# Patient Record
Sex: Female | Born: 1975 | Race: White | Hispanic: No | Marital: Single | Smoking: Former smoker
Health system: Southern US, Community
[De-identification: ages and names within clinical notes are randomized; demographics above are authoritative.]

## PROBLEM LIST (undated history)

## (undated) DIAGNOSIS — Z87898 Personal history of other specified conditions: Secondary | ICD-10-CM

## (undated) DIAGNOSIS — I10 Essential (primary) hypertension: Secondary | ICD-10-CM

## (undated) DIAGNOSIS — E669 Obesity, unspecified: Secondary | ICD-10-CM

## (undated) DIAGNOSIS — F419 Anxiety disorder, unspecified: Secondary | ICD-10-CM

## (undated) DIAGNOSIS — K219 Gastro-esophageal reflux disease without esophagitis: Secondary | ICD-10-CM

## (undated) HISTORY — DX: Gastro-esophageal reflux disease without esophagitis: K21.9

## (undated) HISTORY — DX: Obesity, unspecified: E66.9

## (undated) HISTORY — DX: Personal history of other specified conditions: Z87.898

## (undated) HISTORY — DX: Essential (primary) hypertension: I10

## (undated) HISTORY — DX: Anxiety disorder, unspecified: F41.9

---

## 2008-04-14 ENCOUNTER — Emergency Department: Payer: Self-pay | Admitting: Emergency Medicine

## 2010-06-20 ENCOUNTER — Inpatient Hospital Stay (INDEPENDENT_AMBULATORY_CARE_PROVIDER_SITE_OTHER)
Admission: RE | Admit: 2010-06-20 | Discharge: 2010-06-20 | Disposition: A | Discharge status: Home / Self Care | Payer: BC Managed Care – PPO | Source: Ambulatory Visit | Attending: Emergency Medicine | Admitting: Emergency Medicine

## 2010-06-20 DIAGNOSIS — J45909 Unspecified asthma, uncomplicated: Secondary | ICD-10-CM

## 2010-06-20 DIAGNOSIS — R05 Cough: Secondary | ICD-10-CM

## 2010-10-07 ENCOUNTER — Encounter: Payer: Self-pay | Admitting: Internal Medicine

## 2010-10-07 ENCOUNTER — Ambulatory Visit (INDEPENDENT_AMBULATORY_CARE_PROVIDER_SITE_OTHER): Payer: BC Managed Care – PPO | Admitting: Internal Medicine

## 2010-10-07 ENCOUNTER — Ambulatory Visit (INDEPENDENT_AMBULATORY_CARE_PROVIDER_SITE_OTHER)
Admission: RE | Admit: 2010-10-07 | Discharge: 2010-10-07 | Disposition: A | Discharge status: Home / Self Care | Payer: BC Managed Care – PPO | Source: Ambulatory Visit | Attending: Internal Medicine | Admitting: Internal Medicine

## 2010-10-07 VITALS — BP 128/80 | HR 71 | Temp 98.8°F | Ht 69.0 in | Wt 219.0 lb

## 2010-10-07 DIAGNOSIS — R05 Cough: Secondary | ICD-10-CM

## 2010-10-07 DIAGNOSIS — R059 Cough, unspecified: Secondary | ICD-10-CM

## 2010-10-07 DIAGNOSIS — K219 Gastro-esophageal reflux disease without esophagitis: Secondary | ICD-10-CM

## 2010-10-07 DIAGNOSIS — R051 Acute cough: Secondary | ICD-10-CM | POA: Insufficient documentation

## 2010-10-07 NOTE — Assessment & Plan Note (Signed)
Seems to have been infectious and may have improved with recent antibiotic course Mild symptoms now CXR looks fine (?slight increased bronchial markings) Spirometry normal  Discussed stopping all cigarettes (rare now) Recommended no Rx  Try to increase activity Consider GERD Rx if cough persists

## 2010-10-07 NOTE — Assessment & Plan Note (Addendum)
Mild throat symptoms chronically but not enough to warrant Rx unless the cough is persistent--then it would make sense to try acid blocking rx Discussed limiting her caffeinated tea

## 2010-10-07 NOTE — Progress Notes (Signed)
  Subjective:    Patient ID: Linda Love, female    DOB: 12-18-1975, 35 y.o.   MRN: [id number]  HPI Wanted to establish as patient  Concerned about respiratory infection she caught in March Had to go to urgent care at [facility] for persistent cough Given inhaler and nasal spray Felt like she never cleared out the congestion in chest occ wheezing Never had previous respiratory problems Finally clearing up some but not back to normal  Used a lot of mucinex Didn't get antibiotic but used septra DS that was father's---felt it helped some. Finished it about 2 weeks ago after 10 days Rx Got rash around her waist after being out in the sun --discussed photosensitivity  No overt heartburn Does get some throat clearing after eating and occ voice changes No globus feeling or dysphagia  No current outpatient prescriptions on file prior to visit.    No Known Allergies  No past medical history on file.  No past surgical history on file.  Family History  Problem Relation Age of Onset  . Hypertension Mother   . Cancer Mother     skin cancer  . Alcohol abuse Father   . Heart disease Father   . Atrial fibrillation Father   . Cancer Maternal Aunt     lymphoma  . Atrial fibrillation Paternal Aunt   . Diabetes Paternal Grandmother   . Cancer Cousin     History   Social History  . Marital Status: Single    Spouse Name: N/A    Number of Children: N/A  . Years of Education: N/A   Occupational History  . Production assistant, radio   Social History Main Topics  . Smoking status: Current Some Day Smoker  . Smokeless tobacco: Never Used   Comment: has cut down since the cough. Rarely now  . Alcohol Use: Yes  . Drug Use: No  . Sexually Active: Not on file   Other Topics Concern  . Not on file   Social History Narrative  . No narrative on file   Review of Systems Noticed cracking at right angle of lip since all the cough Had shingles across abdomen some years  ago--worried about recurrence when she got the rash No chest pain No palpitations No edema Did notice "gurgle" under sternum while on the antibiotic Did have some heartburn with stress last year---not an ongoing problem No enviromnental allergies Has almost stopped smoking completely Tends to turn to food with stress----occ bingeing but this is better Has lost about 10# in past 6 months     Objective:   Physical Exam        Assessment & Plan:

## 2010-12-03 ENCOUNTER — Encounter: Payer: Self-pay | Admitting: Internal Medicine

## 2010-12-04 ENCOUNTER — Ambulatory Visit (INDEPENDENT_AMBULATORY_CARE_PROVIDER_SITE_OTHER): Payer: BC Managed Care – PPO | Admitting: Internal Medicine

## 2010-12-04 ENCOUNTER — Encounter: Payer: Self-pay | Admitting: Internal Medicine

## 2010-12-04 VITALS — BP 122/76 | HR 76 | Temp 98.6°F | Ht 69.0 in | Wt 221.8 lb

## 2010-12-04 DIAGNOSIS — J020 Streptococcal pharyngitis: Secondary | ICD-10-CM | POA: Insufficient documentation

## 2010-12-04 DIAGNOSIS — J029 Acute pharyngitis, unspecified: Secondary | ICD-10-CM

## 2010-12-04 LAB — POCT RAPID STREP A (OFFICE): Rapid Strep A Screen: POSITIVE — AB

## 2010-12-04 MED ORDER — PENICILLIN V POTASSIUM 500 MG PO TABS: 1000.0000 mg | ORAL_TABLET | Freq: Two times a day (BID) | ORAL | Status: AC

## 2010-12-04 NOTE — Progress Notes (Signed)
  Subjective:    Patient ID: Linda Love, female    DOB: 12-Apr-1975, 35 y.o.   MRN: [id number]  HPI 4 days ago started not feeling well Awoke 3 days ago with mouthful of yellow phlegm with blood in mouth Had fever and aching the next day Some shakes at night Skipped work 2 days ago  Progressively worsening sore throat Did some better with aching yesterday Has seen white patches in throat Some neck tenderness  Ears popping  Only brief nasal congestion--and still with AM drainage No cough  Tried OTC cold meds and advil---may have helped some  Current Outpatient Prescriptions on File Prior to Visit  Medication Sig Dispense Refill  . Chaste Tree (VITEX EXTRACT PO) Take by mouth daily as needed.        . Echinacea 500 MG CAPS Take by mouth daily.        Marland Kitchen RASPBERRY PO Take by mouth daily as needed.          No Known Allergies  Past Medical History  Diagnosis Date  . GERD (gastroesophageal reflux disease)     ??mild LPR symptoms    No past surgical history on file.  Family History  Problem Relation Age of Onset  . Hypertension Mother   . Cancer Mother     skin cancer  . Alcohol abuse Father   . Heart disease Father   . Atrial fibrillation Father   . Cancer Maternal Aunt     lymphoma  . Atrial fibrillation Paternal Aunt   . Diabetes Paternal Grandmother   . Cancer Cousin     History   Social History  . Marital Status: Single    Spouse Name: N/A    Number of Children: N/A  . Years of Education: N/A   Occupational History  . Production assistant, radio   Social History Main Topics  . Smoking status: Former Smoker    Quit date: 10/28/2010  . Smokeless tobacco: Never Used   Comment: has cut down since the cough. Rarely now  . Alcohol Use: Yes  . Drug Use: No  . Sexually Active: Not on file   Other Topics Concern  . Not on file   Social History Narrative  . No narrative on file      Review of Systems No rash Slight nausea but no major  nausea Able to eat    Objective:   Physical Exam  Constitutional: She appears well-developed and well-nourished. No distress.  HENT:  Right Ear: External ear normal.  Left Ear: External ear normal.       No sinus tenderness Moderate pharyngeal injection with slight exudates + palatal petechiae  Neck: Normal range of motion. Neck supple.       Small slightly tender ant cervical nodes  Pulmonary/Chest: Effort normal and breath sounds normal. No respiratory distress. She has no wheezes. She has no rales.  Lymphadenopathy:    She has cervical adenopathy.  Skin: No rash noted.          Assessment & Plan:

## 2010-12-04 NOTE — Assessment & Plan Note (Signed)
Classic presentation and rapid test positive Around a lot of family (youngsters) last weekend Will treat with PCN Supportive Rx

## 2011-02-15 ENCOUNTER — Encounter: Payer: Self-pay | Admitting: Family Medicine

## 2011-02-15 ENCOUNTER — Ambulatory Visit (INDEPENDENT_AMBULATORY_CARE_PROVIDER_SITE_OTHER): Payer: BC Managed Care – PPO | Admitting: Family Medicine

## 2011-02-15 VITALS — BP 110/72 | HR 97 | Temp 98.5°F | Ht 69.0 in | Wt 217.5 lb

## 2011-02-15 DIAGNOSIS — R55 Syncope and collapse: Secondary | ICD-10-CM

## 2011-02-15 LAB — BASIC METABOLIC PANEL
CO2: 26 mEq/L (ref 19–32)
Calcium: 8.4 mg/dL (ref 8.4–10.5)
Chloride: 103 mEq/L (ref 96–112)
Glucose, Bld: 111 mg/dL — ABNORMAL HIGH (ref 70–99)
Sodium: 137 mEq/L (ref 135–145)

## 2011-02-15 LAB — CBC WITH DIFFERENTIAL/PLATELET
Basophils Absolute: 0 10*3/uL (ref 0.0–0.1)
Eosinophils Absolute: 0 10*3/uL (ref 0.0–0.7)
Hemoglobin: 14.2 g/dL (ref 12.0–15.0)
Lymphocytes Relative: 2.2 % — ABNORMAL LOW (ref 12.0–46.0)
Lymphs Abs: 0.3 10*3/uL — ABNORMAL LOW (ref 0.7–4.0)
MCHC: 33.9 g/dL (ref 30.0–36.0)
MCV: 94 fl (ref 78.0–100.0)
Monocytes Absolute: 0.3 10*3/uL (ref 0.1–1.0)
Neutro Abs: 11.9 10*3/uL — ABNORMAL HIGH (ref 1.4–7.7)
RDW: 13 % (ref 11.5–14.6)

## 2011-02-15 NOTE — Progress Notes (Signed)
Subjective:    Patient ID: Linda Love, female    DOB: August 30, 1975, 35 y.o.   MRN: [id number]  HPI  35 yo pt of Dr. Alphonsus Sias here for syncopal episode.  Has an issue with binging and purging- never formally diagnosed with bulimia but admits to having these episodes. Last night, she ate a large pizza, soup, a package of noodles. Approximately one hour after she ate, she felt very uncomfortable and purged.  Woke up in middle of night to get water, felt nauseated and vomited a larger amount. Non bloody. No fever, diarrhea or abdominal pain.  This morning, stood up, felt very dizzy and passed out. She hit her head on counter. Other than a sore head, feels ok.  No CP or SOB.  Patient Active Problem List  Diagnoses  . Cough  . GERD (gastroesophageal reflux disease)  . Strep pharyngitis   Past Medical History  Diagnosis Date  . GERD (gastroesophageal reflux disease)     ??mild LPR symptoms   No past surgical history on file. History  Substance Use Topics  . Smoking status: Former Smoker    Quit date: 10/28/2010  . Smokeless tobacco: Never Used   Comment: has cut down since the cough. Rarely now  . Alcohol Use: Yes   Family History  Problem Relation Age of Onset  . Hypertension Mother   . Cancer Mother     skin cancer  . Alcohol abuse Father   . Heart disease Father   . Atrial fibrillation Father   . Cancer Maternal Aunt     lymphoma  . Atrial fibrillation Paternal Aunt   . Diabetes Paternal Grandmother   . Cancer Cousin    No Known Allergies Current Outpatient Prescriptions on File Prior to Visit  Medication Sig Dispense Refill  . ibuprofen (ADVIL,MOTRIN) 200 MG tablet Take 400 mg by mouth every 6 (six) hours as needed.        . Misc Natural Products (GINSENG COMPLEX PO) Take 1 tablet by mouth daily.        . Multiple Vitamin (MULTIVITAMIN) tablet Take 1 tablet by mouth daily.        Marland Kitchen RASPBERRY PO Take by mouth daily as needed.        . Chaste Tree (VITEX  EXTRACT PO) Take by mouth daily as needed.        . Echinacea 500 MG CAPS Take by mouth daily.        . Pseudoeph-Doxylamine-DM-APAP (NYQUIL PO) Take by mouth as directed.           Review of Systems    See HPI Objective:   Physical Exam BP 110/72  Pulse 97  Temp(Src) 98.5 F (36.9 C) (Oral)  Ht 5\' 9"  (1.753 m)  Wt 217 lb 8 oz (98.657 kg)  BMI 32.12 kg/m2  LMP 02/06/2011  General:  Well-developed,well-nourished,in no acute distress; alert,appropriate and cooperative throughout examination Head:  normocephalic and atraumatic.   Eyes:  vision grossly intact, pupils equal, pupils round, and pupils reactive to light.   Ears:  R ear normal and L ear normal.   Nose:  no external deformity.   Mouth:  good dentition.   Neck:  No deformities, masses, or tenderness noted. Lungs:  Normal respiratory effort, chest expands symmetrically. Lungs are clear to auscultation, no crackles or wheezes. Heart:  Normal rate and regular rhythm. S1 and S2 normal without gallop, murmur, click, rub or other extra sounds. Extremities:  No clubbing, cyanosis, edema, or deformity noted  with normal full range of motion of all joints.   Neurologic:  alert & oriented X3 and gait normal.   Skin:  Intact without suspicious lesions or rashes Psych:  Cognition and judgment appear intact. Alert and cooperative with normal attention span and concentration. No apparent delusions, illusions, hallucinations     Assessment & Plan:   1. Syncope    New and resolved. Likely due to orthostasis/vasovagal response due to multiple episodes of vomiting and binging/purging. EKG- sinus rhythm with non specific changes and pause, likely normal variant. Advise a Holter monitor if continues.  Will check labs today. Orders Placed This Encounter  Procedures  . Basic Metabolic Panel (BMET)  . CBC w/Diff  . TSH  . EKG 12-Lead    I did advise her talking with her primary MD about her eating disorder. The patient indicates  understanding of these issues and agrees with the plan. She has CPX scheduled with her primary.

## 2011-02-15 NOTE — Patient Instructions (Addendum)
Nice to meet you. Have a nice Thanksgiving. Please go straight to the ER if you start developing chest pain.  Let us know if you would like to consider the Holter monitor we talked about. Keep your appointment with Dr. Alphonsus Sias.

## 2011-03-30 DIAGNOSIS — Z87898 Personal history of other specified conditions: Secondary | ICD-10-CM

## 2011-03-30 HISTORY — PX: LEEP: SHX91

## 2011-03-30 HISTORY — DX: Personal history of other specified conditions: Z87.898

## 2011-04-16 ENCOUNTER — Encounter: Payer: BC Managed Care – PPO | Admitting: Internal Medicine

## 2011-04-22 ENCOUNTER — Other Ambulatory Visit (HOSPITAL_COMMUNITY)
Admission: RE | Admit: 2011-04-22 | Discharge: 2011-04-22 | Disposition: A | Discharge status: Home / Self Care | Payer: BC Managed Care – PPO | Source: Ambulatory Visit | Attending: Internal Medicine | Admitting: Internal Medicine

## 2011-04-22 ENCOUNTER — Encounter: Payer: Self-pay | Admitting: Internal Medicine

## 2011-04-22 ENCOUNTER — Ambulatory Visit (INDEPENDENT_AMBULATORY_CARE_PROVIDER_SITE_OTHER): Payer: BC Managed Care – PPO | Admitting: Internal Medicine

## 2011-04-22 VITALS — BP 120/80 | HR 75 | Temp 97.9°F | Ht 69.0 in | Wt 219.0 lb

## 2011-04-22 DIAGNOSIS — Z01419 Encounter for gynecological examination (general) (routine) without abnormal findings: Secondary | ICD-10-CM | POA: Insufficient documentation

## 2011-04-22 DIAGNOSIS — Z0000 Encounter for general adult medical examination without abnormal findings: Secondary | ICD-10-CM

## 2011-04-22 DIAGNOSIS — E669 Obesity, unspecified: Secondary | ICD-10-CM | POA: Insufficient documentation

## 2011-04-22 NOTE — Assessment & Plan Note (Signed)
Generally healthy Pap done Discussed fitness

## 2011-04-22 NOTE — Patient Instructions (Signed)
Please try weight watchers on a regular basis Begin a regular exercise program---for fitness and to relax

## 2011-04-22 NOTE — Progress Notes (Signed)
Subjective:    Patient ID: Linda Love, female    DOB: July 13, 1975, 36 y.o.   MRN: [id number]  HPI Here for physical No syncopal spells since last visit She does tend to binge but doesn't usually purge She felt bad that time and may have had a bug--that is why she tried to vomit  Does tend to have some mood issues Not happy with her job Has episodic down times and just wants to stay at home Good times with friends--not anhedonic Father was alcoholic---no direct abuse in past  Has had a cold this week Nothing too bad Tired feeling and head congestion No sig cough  Current Outpatient Prescriptions on File Prior to Visit  Medication Sig Dispense Refill  . Multiple Vitamin (MULTIVITAMIN) tablet Take 1 tablet by mouth daily.          No Known Allergies  Past Medical History  Diagnosis Date  . GERD (gastroesophageal reflux disease)     ??mild LPR symptoms    No past surgical history on file.  Family History  Problem Relation Age of Onset  . Hypertension Mother   . Cancer Mother     skin cancer  . Alcohol abuse Father   . Heart disease Father   . Atrial fibrillation Father   . Cancer Maternal Aunt     lymphoma  . Atrial fibrillation Paternal Aunt   . Diabetes Paternal Grandmother   . Cancer Cousin     History   Social History  . Marital Status: Single    Spouse Name: N/A    Number of Children: N/A  . Years of Education: N/A   Occupational History  . Production assistant, radio   Social History Main Topics  . Smoking status: Former Smoker    Quit date: 10/28/2010  . Smokeless tobacco: Never Used   Comment: has cut down since the cough. Rarely now  . Alcohol Use: Yes  . Drug Use: No  . Sexually Active: Not on file   Other Topics Concern  . Not on file   Social History Narrative  . No narrative on file   Review of Systems  Constitutional: Negative for fatigue and unexpected weight change.       Overweight but no sig change over  time Wears seat belt  HENT: Positive for congestion and rhinorrhea. Negative for hearing loss, dental problem and tinnitus.        Cold this week Sees dentist regularly  Eyes: Negative for visual disturbance.       No unilateral vision loss or diplopia  Respiratory: Positive for cough. Negative for chest tightness and shortness of breath.   Cardiovascular: Positive for chest pain, palpitations and leg swelling.       Spell of palpitations a couple of months ago when overdid it with caffeine Occ very brief shooting pain in chest with big breath Occ slight edema at times  Gastrointestinal: Negative for nausea, vomiting, abdominal pain, constipation and blood in stool.       Past constipation but not recently No heartburn  Genitourinary: Negative for dysuria and difficulty urinating.       Periods are fairly regular No pain or excessive bleeding No sexual problem---condoms for birth control  Musculoskeletal: Negative for back pain, joint swelling and arthralgias.  Skin: Negative for color change and rash.       Several moles she wants checked One small red one on chest that itches  Neurological: Positive for syncope. Negative  for dizziness, weakness, light-headedness, numbness and headaches.       Tingling in arms at work---thinks it is a stress reaction   Hematological: Negative for adenopathy. Does not bruise/bleed easily.  Psychiatric/Behavioral: Positive for sleep disturbance. Negative for dysphoric mood. The patient is nervous/anxious.        Some trouble initiating sleep but then does okay. Tends to stay up too late---some anxiety Friends are married with kids---is trying to expand her social interactions with singles Investigating going back to church      Objective:   Physical Exam  Constitutional: She is oriented to person, place, and time. She appears well-developed and well-nourished. No distress.  HENT:  Head: Normocephalic and atraumatic.  Right Ear: External ear  normal.  Left Ear: External ear normal.  Mouth/Throat: Oropharynx is clear and moist. No oropharyngeal exudate.  Eyes: Conjunctivae and EOM are normal. Pupils are equal, round, and reactive to light.  Neck: Normal range of motion. Neck supple. No thyromegaly present.  Cardiovascular: Normal rate, regular rhythm, normal heart sounds and intact distal pulses.  Exam reveals no gallop.   No murmur heard. Pulmonary/Chest: Effort normal and breath sounds normal. No respiratory distress. She has no wheezes. She has no rales.  Abdominal: Soft. There is no tenderness.  Genitourinary:       Breast exam shows no masses or discharge. Mild cystic changes  Normal introitus Cervix appears normal--mild metaplasia Benign bimanual--though limited Pap done  Musculoskeletal: Normal range of motion. She exhibits no edema and no tenderness.  Lymphadenopathy:    She has no cervical adenopathy.    She has no axillary adenopathy.  Neurological: She is alert and oriented to person, place, and time.  Skin: No rash noted.       Multiple benign nevi--some 6mm or more but not worrisome  Psychiatric: She has a normal mood and affect. Her behavior is normal. Judgment and thought content normal.          Assessment & Plan:

## 2011-04-22 NOTE — Assessment & Plan Note (Signed)
No true eating disorder but does have improper eating habits  Discussed weight watchers and increased exercise

## 2011-04-22 NOTE — Assessment & Plan Note (Signed)
Can use meds prn if increased symptoms

## 2011-04-30 ENCOUNTER — Telehealth: Payer: Self-pay | Admitting: *Deleted

## 2011-04-30 DIAGNOSIS — IMO0002 Reserved for concepts with insufficient information to code with codable children: Secondary | ICD-10-CM

## 2011-04-30 NOTE — Telephone Encounter (Signed)
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

## 2011-04-30 NOTE — Telephone Encounter (Signed)
.  left message to have patient return my call.  

## 2011-05-04 DIAGNOSIS — IMO0002 Reserved for concepts with insufficient information to code with codable children: Secondary | ICD-10-CM | POA: Insufficient documentation

## 2011-05-04 DIAGNOSIS — R87613 High grade squamous intraepithelial lesion on cytologic smear of cervix (HGSIL): Secondary | ICD-10-CM | POA: Insufficient documentation

## 2011-05-04 NOTE — Telephone Encounter (Signed)
Discussed with her Will probably get colposcopy No rush at this point Will set up gyn eval

## 2011-05-04 NOTE — Telephone Encounter (Signed)
Spoke with patient and she would like [facility], pt is nervous and doesn't know what to expect, she would like a call from Dr. Alphonsus Sias if possible. [phone]

## 2011-05-17 ENCOUNTER — Encounter: Payer: BC Managed Care – PPO | Admitting: Obstetrics & Gynecology

## 2011-05-24 ENCOUNTER — Encounter: Payer: Self-pay | Admitting: Family Medicine

## 2011-05-24 ENCOUNTER — Ambulatory Visit (INDEPENDENT_AMBULATORY_CARE_PROVIDER_SITE_OTHER): Payer: BC Managed Care – PPO | Admitting: Family Medicine

## 2011-05-24 VITALS — BP 128/89 | HR 60 | Ht 69.0 in | Wt 223.0 lb

## 2011-05-24 DIAGNOSIS — R87613 High grade squamous intraepithelial lesion on cytologic smear of cervix (HGSIL): Secondary | ICD-10-CM

## 2011-05-24 DIAGNOSIS — D069 Carcinoma in situ of cervix, unspecified: Secondary | ICD-10-CM | POA: Insufficient documentation

## 2011-05-24 NOTE — Patient Instructions (Signed)
Colposcopy Care After Colposcopy is a procedure in which a special tool is used to magnify the surface of the cervix. A tissue sample (biopsy) may also be taken. This sample will be looked at for cervical cancer or other problems. After the test:  You may have some cramping.   Lie down for a few minutes if you feel lightheaded.    You may have some bleeding which should stop in a few days.  HOME CARE  Do not have sex or use tampons for 2 to 3 days or as told.   Only take medicine as told by your doctor.   Continue to take your birth control pills as usual.  Finding out the results of your test Ask when your test results will be ready. Make sure you get your test results. GET HELP RIGHT AWAY IF:  You are bleeding a lot or are passing blood clots.   You develop a fever of 102 F (38.9 C) or higher.   You have abnormal vaginal discharge.   You have cramps that do not go away with medicine.   You feel lightheaded, dizzy, or pass out (faint).  MAKE SURE YOU:   Understand these instructions.   Will watch your condition.   Will get help right away if you are not doing well or get worse.  Document Released: 09/01/2007 Document Revised: 11/25/2010 Document Reviewed: 09/01/2007 [employer] Patient Information 2012 [employer]. 

## 2011-05-24 NOTE — Progress Notes (Signed)
Here today after her pap smear came back in [facility].  Has not had a pap smear in some time, she thinks > 5 years. Past Medical History  Diagnosis Date  . GERD (gastroesophageal reflux disease)     ??mild LPR symptoms  . History of abnormal Pap smear 2013   History reviewed. No pertinent past surgical history. History   Social History  . Marital Status: Single    Spouse Name: N/A    Number of Children: N/A  . Years of Education: N/A   Occupational History  . Production assistant, radio   Social History Main Topics  . Smoking status: Former Smoker    Quit date: 10/28/2010  . Smokeless tobacco: Never Used   Comment: has cut down since the cough. Rarely now  . Alcohol Use: Yes     rarely  . Drug Use: No  . Sexually Active: Not Currently -- Female partner(s)    Birth Control/ Protection: Condom   Other Topics Concern  . Not on file   Social History Narrative  . No narrative on file   Family History  Problem Relation Age of Onset  . Hypertension Mother   . Cancer Mother     skin cancer  . Alcohol abuse Father   . Heart disease Father   . Atrial fibrillation Father   . Cancer Maternal Aunt     lymphoma  . Atrial fibrillation Paternal Aunt   . Diabetes Paternal Grandmother   . Cancer Cousin 40    Breast   Review of Systems  Constitutional: Negative for fever and chills.  Respiratory: Negative for shortness of breath.   Cardiovascular: Negative for chest pain and leg swelling.  Gastrointestinal: Negative for abdominal pain, constipation and melena.  Genitourinary:       No menorrhagia  Neurological: Negative for weakness.   Physical Exam  Vitals reviewed. Constitutional: She is oriented to person, place, and time. She appears well-developed and well-nourished.  HENT:  Head: Normocephalic and atraumatic.  Eyes: No scleral icterus.  Neck: Neck supple.  Cardiovascular: Normal rate.   Pulmonary/Chest: Effort normal.  Abdominal: Soft. There is no  tenderness.  Genitourinary: Vagina normal and uterus normal.  Neurological: She is alert and oriented to person, place, and time.   Procedure Patient given informed consent, signed copy in the chart, time out was performed.  Placed in lithotomy position. Cervix viewed with speculum and colposcope after application of acetic acid.   Colposcopy adequate?  yes Acetowhite lesions?fine one at 12 o'clock with punctation and mosaicism, dense AWE along the entire inferior portion of the cervix. Punctation?yes Mosaicism?  yes Abnormal vasculature?  yes Biopsies?yes at 12 and 5 o'clock ECC?yes  COMMENTS: Patient was given post procedure instructions.   Will call with results.

## 2011-05-28 NOTE — Progress Notes (Signed)
Spoke to patient regarding results, she will have to check her schedule and call back next week to schedule the LEEP procedure.

## 2011-06-17 ENCOUNTER — Ambulatory Visit (INDEPENDENT_AMBULATORY_CARE_PROVIDER_SITE_OTHER): Payer: BC Managed Care – PPO | Admitting: Obstetrics & Gynecology

## 2011-06-17 VITALS — BP 119/79 | HR 73 | Ht 69.0 in | Wt 220.0 lb

## 2011-06-17 DIAGNOSIS — R87613 High grade squamous intraepithelial lesion on cytologic smear of cervix (HGSIL): Secondary | ICD-10-CM

## 2011-06-17 DIAGNOSIS — N871 Moderate cervical dysplasia: Secondary | ICD-10-CM

## 2011-06-17 DIAGNOSIS — IMO0002 Reserved for concepts with insufficient information to code with codable children: Secondary | ICD-10-CM

## 2011-06-17 NOTE — Patient Instructions (Signed)
Loop Electrosurgical Excision Procedure Loop electrosurgical excision procedure (LEEP) is the removal of a portion of the lower part of the uterus (cervix). The procedure is done when there are significantly abnormal cervical cell changes. Abnormal cell changes of the cervix can lead to cancer if left in place and untreated.  The LEEP procedure itself typically only takes a few minutes. Often, it may be done in your caregiver's office. The procedure is considered safe for those who wish to get pregnant or are trying to get pregnant. Only under rare circumstances should this procedure be done if you are pregnant. LET YOUR CAREGIVER KNOW ABOUT:  Whether you are pregnant or late for your last menstrual period.   Allergies to foods or medicines.   All the medicines you are taking includingherbs, eyedrops, and over-the-counter medicines, and creams.   Use of steroids (by mouth or creams).   Previous problems with anesthetics or numbing medicine.   Previous gynecological surgery.   History of blood clots or bleeding problems.   Any recent or current vaginal infections (herpes, sexually transmitted infections).   Other health problems.  RISKS AND COMPLICATIONS  Bleeding.   Infection.   Injury to the vagina, bladder, or rectum.   Very rare obstruction of the cervical opening that causes problems during menstruation (cervical stenosis).  PROCEDURE   A tool (speculum) is placed in the vagina. This allows your caregiver to see the cervix.   An iodine stain is applied to the cervix to find the area of abnormal cells to be removed.   Medicine is injected to numb the cervix (local anesthetic).    Electricity is passed through a thin wire loop which is then used to remove (cauterize) a small segment of the affected cervix.   Light electrocautery is used to seal any small blood vessels and prevent bleeding.   A paste may be applied to the cauterized area of the cervix to help prevent  bleeding.   The tissue sample is sent to the lab. It is examined under the microscope.  HOME CARE INSTRUCTIONS   Do not use tampons, douche, or have sexual intercourse for 2 weeks or as directed by your caregiver.   Begin normal activities if you have no or minimal cramping or bleeding, unless directed otherwise by your caregiver.   Take your temperature if you feel sick. Write down your temperature on paper, and tell your caregiver if you have a fever.   Take all medicines as directed by your caregiver.   Keep all your follow-up appointments and Pap tests as directed by your caregiver.  SEEK IMMEDIATE MEDICAL CARE IF:   You have bleeding that is heavier or longer than a normal menstrual cycle.   You have bleeding that is bright red.   You have blood clots.   You have a fever.   You have increasing cramps or pain not relieved by medicine.   You develop abdominal pain that does not seem to be related to the same area of earlier cramping and pain.   You are lightheaded, unusually weak, or faint.   You develop painful or bloody urination.   You develop a bad smelling vaginal discharge.  MAKE SURE YOU:  Understand these instructions.   Will watch your condition.   Will get help right away if you are not doing well or get worse.  Document Released: 11/26/2010 Document Revised: 03/04/2011 Document Reviewed: 11/26/2010 [facility] Patient Information 2012 [place], [place].

## 2011-06-17 NOTE — Progress Notes (Signed)
History:  36 y.o. 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 No GYN concerns.  The following portions of the patient's history were reviewed and updated as appropriate: allergies, current medications, past family history, past medical history, past social history, past surgical history and problem list.   Objective:  Physical Exam Blood pressure 119/79, pulse 73, height 5\' 9"  (1.753 m), weight 220 lb (99.791 kg). Gen: NAD Abd: Soft, nontender and nondistended Pelvic: Normal appearing external genitalia; normal appearing vaginal mucosa and cervix.  Normal discharge.  Small uterus, no palpable masses or adnexal tenderness.   LEEP PROCEDURE NOTE Pap smear and colposcopy reviewed.   Pap HGSIL Colpo Biopsy CIN II ECC  negative Risks, benefits, alternatives, and limitations of procedure explained to patient, including pain, bleeding, infection, failure to remove abnormal tissue and failure to cure dysplasia, need for repeat procedures, damage to pelvic organs, cervical incompetence.  Role of HPV,cervical dysplasia and need for close followup was empasized. Informed written consent was obtained. All questions were answered. Time out performed.  ??Procedure Details: The patient was placed in lithotomy position and the bivalved coated speculum was placed in the patient's vagina. A grounding pad placed on the patient. Lugol's solution was applied to the cervix and areas of decreased uptake were noted around the transformation zone.   Local anesthesia was administered via an intracervical block using 10cc of 1% Lidocaine with epinephrine. The suction was turned on and the Medium 1X Fisher Cone Biopsy Excisor on 50 Watts of cutting current was used to excise the area of decreased uptake and excise the entire transformation zone. Excellent hemostasis was achieved using roller ball coagulation set at 50 Watts coagulation current.  Monsel's solution was then applied and the speculum was removed from the vagina. Specimen was sent to pathology. ?The patient tolerated the procedure well.    Assessment & Plan:  Post-operative instructions given to patient, including instruction to seek medical attention for persistent bright red bleeding, fever, abdominal/pelvic pain, dysuria, nausea or vomiting. She was also told about the possibility of having copious yellow to black tinged discharge. She was counseled to avoid anything in the vagina (sex/douching/tampons) for 3 weeks. She has a  4 week post-operative check to review results and assess wound healing.  Follow up in 4 months for repeat pap. She then will need pap smears every six months until she has two consecutive normal pap smears, then she can resume annual screening. If any pap smear is abnormal, she will need repeat colposcopy and appropriate evaluation.

## 2011-06-22 ENCOUNTER — Encounter: Payer: Self-pay | Admitting: Obstetrics & Gynecology

## 2011-07-14 ENCOUNTER — Encounter: Payer: Self-pay | Admitting: Obstetrics & Gynecology

## 2011-07-14 ENCOUNTER — Ambulatory Visit (INDEPENDENT_AMBULATORY_CARE_PROVIDER_SITE_OTHER): Payer: BC Managed Care – PPO | Admitting: Obstetrics & Gynecology

## 2011-07-14 VITALS — BP 140/75 | HR 74 | Ht 69.0 in | Wt 219.0 lb

## 2011-07-14 DIAGNOSIS — D069 Carcinoma in situ of cervix, unspecified: Secondary | ICD-10-CM

## 2011-07-14 NOTE — Progress Notes (Signed)
Patient is here for Leep follow up.

## 2011-07-14 NOTE — Progress Notes (Signed)
History:  36 y.o. 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 She reports having some expected discharge after the procedure and is currently on her period.  No other concerns.  She declines a pelvic exam today.  The following portions of the patient's history were reviewed and updated as appropriate: allergies, current medications, past family history, past medical history, past social history, past surgical history and problem list.  Review of Systems:  A comprehensive review of systems was negative.  Objective:  Physical Exam Blood pressure 140/75, pulse 74, height 5\' 9"  (1.753 m), weight 219 lb (99.338 kg), last menstrual period 07/09/2011. Deferred  LEEP Pathology 06/17/11 - MODERATE AND SEVERE SQUAMOUS DYSPLASIA, CIN-II AND CIN-III. - ENDOCERVICAL MARGIN FOCALLY INVOLVED BY DYSPLASIA. - FOCAL ENDOCERVICAL GLAND INVOLVEMENT BY DYSPLASIA. - ECTOCERVICAL MARGIN NOT INVOLVED.  Assessment & Plan:  Patient to return in 09/2011 for repeat pap smear.  For her cervical dysplasia, patient needs pap smears every six months until she has two consecutive normal pap smears, then she can resume annual screening. If any pap smear is abnormal, she will need repeat colposcopy and appropriate evaluation.

## 2011-07-14 NOTE — Patient Instructions (Signed)
Return to clinic for any scheduled appointments or for any gynecologic concerns as needed.   

## 2011-10-12 ENCOUNTER — Encounter: Payer: Self-pay | Admitting: Obstetrics & Gynecology

## 2011-10-12 ENCOUNTER — Ambulatory Visit (INDEPENDENT_AMBULATORY_CARE_PROVIDER_SITE_OTHER): Payer: BC Managed Care – PPO | Admitting: Obstetrics & Gynecology

## 2011-10-12 VITALS — BP 121/81 | HR 79 | Ht 69.0 in | Wt 219.0 lb

## 2011-10-12 DIAGNOSIS — R87613 High grade squamous intraepithelial lesion on cytologic smear of cervix (HGSIL): Secondary | ICD-10-CM

## 2011-10-12 DIAGNOSIS — D069 Carcinoma in situ of cervix, unspecified: Secondary | ICD-10-CM

## 2011-10-12 DIAGNOSIS — IMO0002 Reserved for concepts with insufficient information to code with codable children: Secondary | ICD-10-CM

## 2011-10-12 NOTE — Progress Notes (Signed)
  Subjective:     Linda Love is a 36 y.o. woman who comes in today for a  pap smear only. 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  She underwent LEEP on 06/17/11 that showed CIN II and CIN III, positive endocervical margins, negative ectocervical margins.  No GYN concerns.  The following portions of the patient's history were reviewed and updated as appropriate: allergies, current medications, past family history, past medical history, past social history, past surgical history and problem list.  Review of Systems Pertinent items are noted in HPI.   Objective:    BP 121/81  Pulse 79  Ht 5\' 9"  (1.753 m)  Wt 219 lb (99.338 kg)  BMI 32.34 kg/m2  LMP 10/07/2011 Pelvic Exam: cervix normal in appearance, external genitalia normal and vagina normal without discharge. Pap smear obtained.   Assessment:    Screening pap smear after LEEP for CIN III.   Plan:    Follow up as indicated by Pap results or for any GYN concerns.

## 2011-10-12 NOTE — Patient Instructions (Addendum)
Return to clinic for any scheduled appointments or for any gynecologic concerns as needed.   

## 2012-02-13 IMAGING — CR DG CHEST 2V
2 series · 2 of 2 positions shown · non-contrast
Comparison: None.

CLINICAL DATA: Cough.  Smoker.

CHEST - 2 VIEW

[view not recorded (1 of 2)]
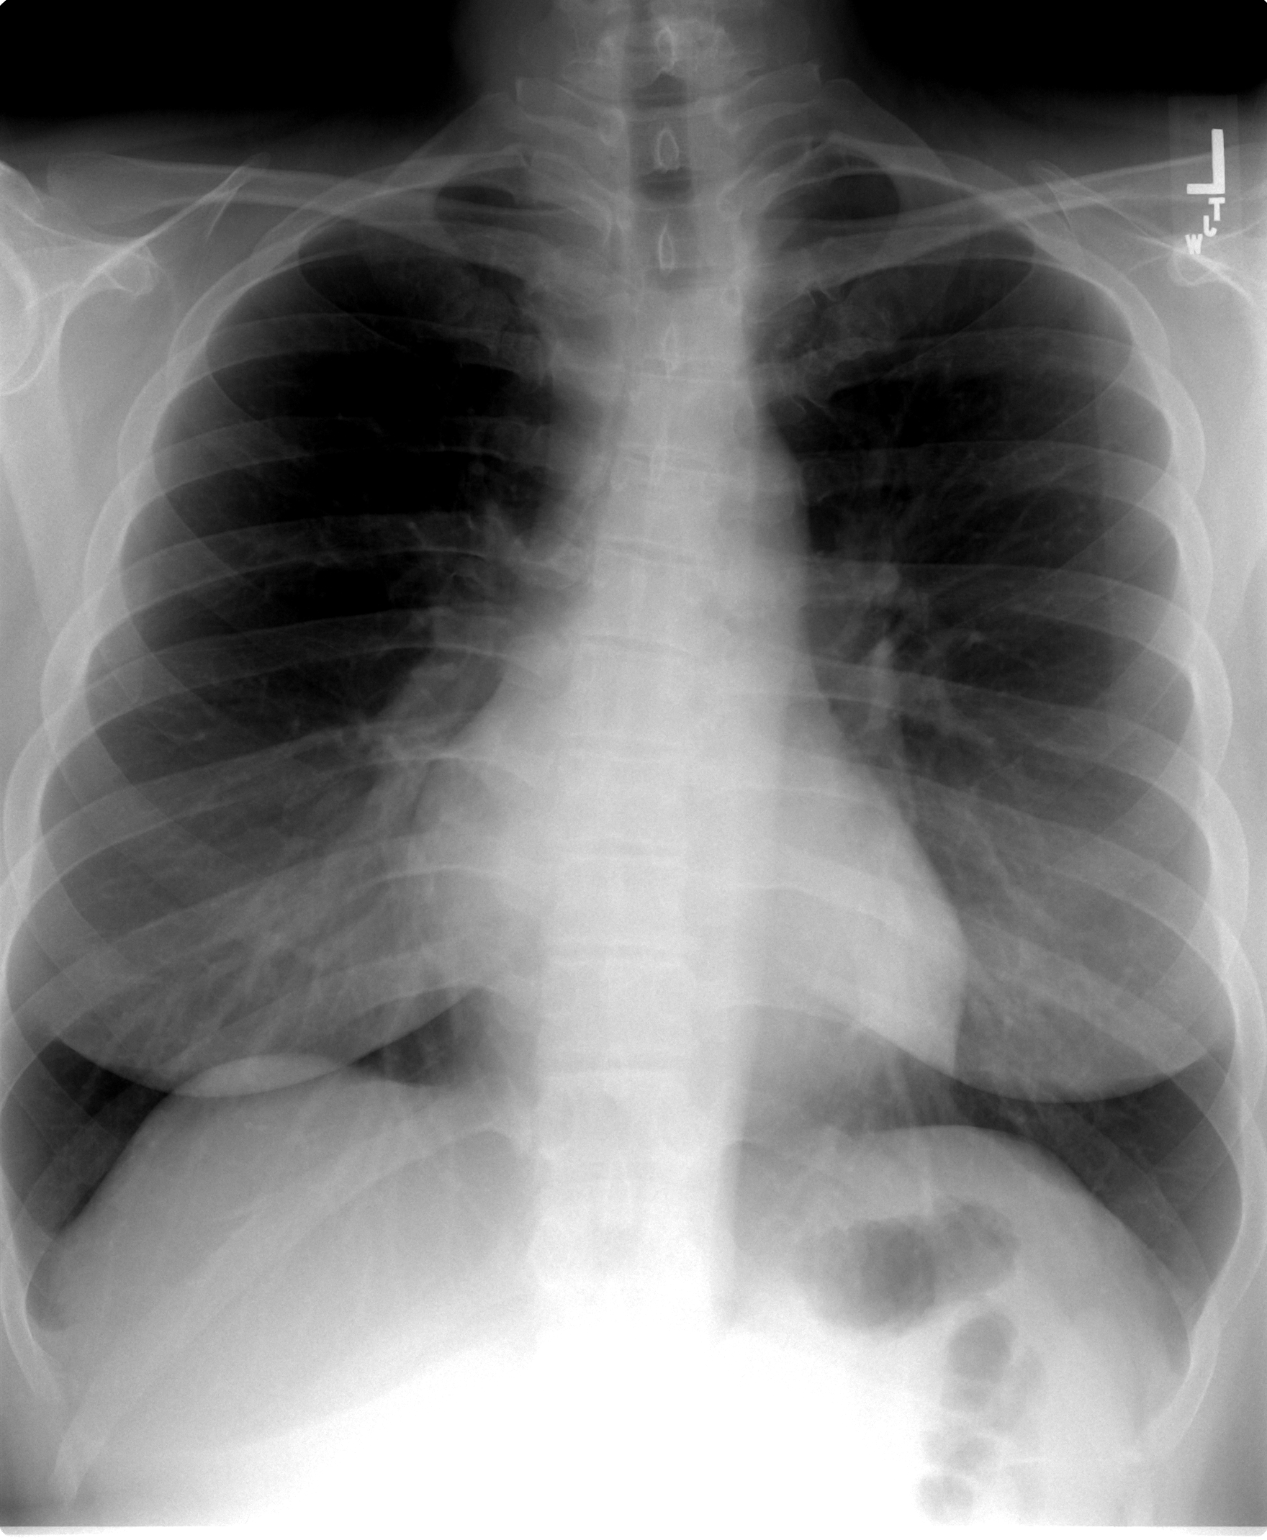

[view not recorded (2 of 2)]
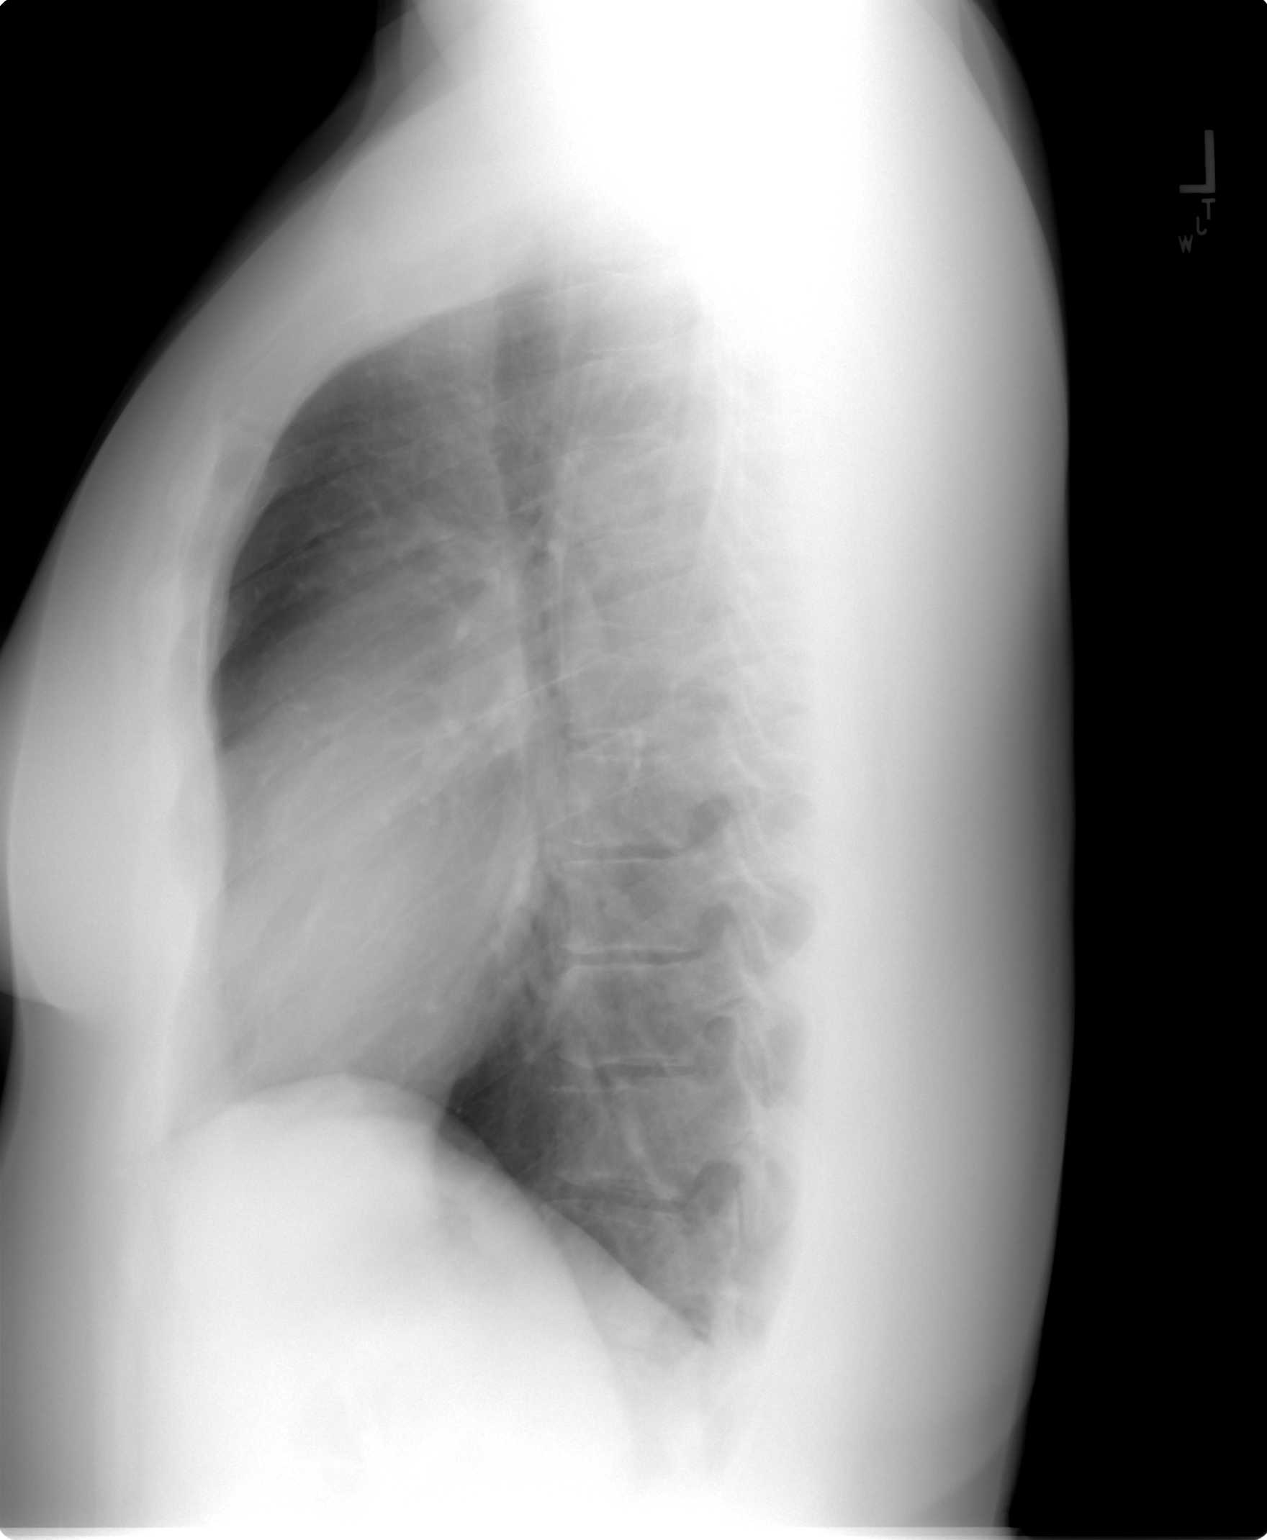

[2 of 2 positions shown; findings below may reference images not displayed]

FINDINGS: Midline trachea.  Mild convex right thoracic spine
curvature. Normal heart size and mediastinal contours. No pleural
effusion or pneumothorax.  Clear lungs.
IMPRESSION: No acute cardiopulmonary disease.

## 2012-08-18 ENCOUNTER — Ambulatory Visit (INDEPENDENT_AMBULATORY_CARE_PROVIDER_SITE_OTHER): Payer: BC Managed Care – PPO | Admitting: Obstetrics & Gynecology

## 2012-08-18 ENCOUNTER — Encounter: Payer: Self-pay | Admitting: Obstetrics & Gynecology

## 2012-08-18 VITALS — BP 138/87 | HR 78 | Resp 16 | Ht 69.0 in | Wt 211.0 lb

## 2012-08-18 DIAGNOSIS — Z7251 High risk heterosexual behavior: Secondary | ICD-10-CM

## 2012-08-18 DIAGNOSIS — Z113 Encounter for screening for infections with a predominantly sexual mode of transmission: Secondary | ICD-10-CM

## 2012-08-18 NOTE — Progress Notes (Signed)
  Subjective:    Patient ID: Linda Love, female    DOB: 1975/11/07, 37 y.o.   MRN: [id number]  HPI  37 yo SW lady who had a new sexual partner last week. She says that he removed the condom during sex and she would like STI testing. She used the third dose of Monistat in/on vagina last night. She also complains of new onset rectal bleeding since this encounter. She denies full -on anal sex.  Review of Systems    Her pap is due 7-8/14 Objective:   Physical Exam  External hemorrhoids noted Vagina full of monistat cream     Assessment & Plan:  STI testing with urine for GC and CT At her annual in 3 months we will get blood for other STI testing She has been advised to see a gen surg or FP for further eval/treatment of her hemorrhoids prn

## 2012-08-19 LAB — GC/CHLAMYDIA PROBE AMP: GC Probe RNA: NEGATIVE

## 2012-09-14 ENCOUNTER — Ambulatory Visit: Payer: BC Managed Care – PPO | Admitting: Obstetrics & Gynecology

## 2012-10-19 ENCOUNTER — Ambulatory Visit: Payer: BC Managed Care – PPO | Admitting: Obstetrics & Gynecology

## 2012-11-03 ENCOUNTER — Encounter: Payer: Self-pay | Admitting: Obstetrics & Gynecology

## 2012-11-03 ENCOUNTER — Ambulatory Visit (INDEPENDENT_AMBULATORY_CARE_PROVIDER_SITE_OTHER): Payer: BC Managed Care – PPO | Admitting: Obstetrics & Gynecology

## 2012-11-03 VITALS — BP 120/78 | HR 70 | Ht 68.0 in | Wt 215.8 lb

## 2012-11-03 DIAGNOSIS — Z01419 Encounter for gynecological examination (general) (routine) without abnormal findings: Secondary | ICD-10-CM

## 2012-11-03 DIAGNOSIS — Z1151 Encounter for screening for human papillomavirus (HPV): Secondary | ICD-10-CM

## 2012-11-03 DIAGNOSIS — Z113 Encounter for screening for infections with a predominantly sexual mode of transmission: Secondary | ICD-10-CM

## 2012-11-03 DIAGNOSIS — L680 Hirsutism: Secondary | ICD-10-CM

## 2012-11-03 DIAGNOSIS — Z0000 Encounter for general adult medical examination without abnormal findings: Secondary | ICD-10-CM

## 2012-11-03 DIAGNOSIS — Z124 Encounter for screening for malignant neoplasm of cervix: Secondary | ICD-10-CM

## 2012-11-03 LAB — CBC
HCT: 41.9 % (ref 36.0–46.0)
Hemoglobin: 13.8 g/dL (ref 12.0–15.0)
MCH: 31.1 pg (ref 26.0–34.0)
MCV: 94.4 fL (ref 78.0–100.0)
Platelets: 280 10*3/uL (ref 150–400)
RBC: 4.44 MIL/uL (ref 3.87–5.11)

## 2012-11-03 NOTE — Addendum Note (Signed)
Addended by: Barbara Cower on: 11/03/2012 09:29 AM   Modules accepted: Orders

## 2012-11-03 NOTE — Progress Notes (Signed)
Patient ID: Sparkle Aube, female   DOB: [birth year], 37 y.o.   MRN: [id number] Subjective:    Darrion Macaulay is a 37 y.o. female who presents for an annual exam. The patient has no complaints today. The patient is sexually active. GYN screening history: last pap: was normal. The patient wears seatbelts: yes. The patient participates in regular exercise: "somewhat".. Has the patient ever been transfused or tattooed?: no. The patient reports that there is not domestic violence in her life.   Menstrual History: OB History   Grav Para Term Preterm Abortions TAB SAB Ect Mult Living   0 0 0 0 0 0 0 0 0 0       Menarche age: 11  Patient's last menstrual period was 10/23/2012.    The following portions of the patient's history were reviewed and updated as appropriate: allergies, current medications, past family history, past medical history, past social history, past surgical history and problem list.  Review of Systems A comprehensive review of systems was negative. Work at [employer]. Denies dyspareunia. No current boyfriend, sex last month without condoms. She wants screening labs and STI testing including HSV.   Objective:    BP 120/78  Pulse 70  Ht 5\' 8"  (1.727 m)  Wt 215 lb 12.8 oz (97.886 kg)  BMI 32.82 kg/m2  LMP 10/23/2012  General Appearance:    Alert, cooperative, no distress, appears stated age  Head:    Normocephalic, without obvious abnormality, atraumatic  Eyes:    PERRL, conjunctiva/corneas clear, EOM's intact, fundi    benign, both eyes  Ears:    Normal TM's and external ear canals, both ears  Nose:   Nares normal, septum midline, mucosa normal, no drainage    or sinus tenderness  Throat:   Lips, mucosa, and tongue normal; teeth and gums normal  Neck:   Supple, symmetrical, trachea midline, no adenopathy;    thyroid:  no enlargement/tenderness/nodules; no carotid   bruit or JVD  Back:     Symmetric, no curvature, ROM normal, no CVA tenderness  Lungs:     Clear to  auscultation bilaterally, respirations unlabored  Chest Wall:    No tenderness or deformity   Heart:    Regular rate and rhythm, S1 and S2 normal, no murmur, rub   or gallop  Breast Exam:    No tenderness, masses, or nipple abnormality  Abdomen:     Soft, non-tender, bowel sounds active all four quadrants,    no masses, no organomegaly  Genitalia:    Normal female without lesion, discharge or tenderness, NSSA, NT, mobile, normal adnexal exam     Extremities:   Extremities normal, atraumatic, no cyanosis or edema  Pulses:   2+ and symmetric all extremities  Skin:   Skin color, texture, turgor normal, no rashes or lesions  Lymph nodes:   Cervical, supraclavicular, and axillary nodes normal  Neurologic:   CNII-XII intact, normal strength, sensation and reflexes    throughout  .    Assessment:    Healthy female exam.    Plan:     Breast self exam technique reviewed and patient encouraged to perform self-exam monthly. Chlamydia specimen. GC specimen. Thin prep Pap smear. with cotesting Screening preventative labs and STI testing.

## 2012-11-04 LAB — COMPREHENSIVE METABOLIC PANEL
ALT: 12 U/L (ref 0–35)
Albumin: 4.1 g/dL (ref 3.5–5.2)
Alkaline Phosphatase: 50 U/L (ref 39–117)
Glucose, Bld: 98 mg/dL (ref 70–99)
Potassium: 4.3 mEq/L (ref 3.5–5.3)
Sodium: 138 mEq/L (ref 135–145)
Total Protein: 6.8 g/dL (ref 6.0–8.3)

## 2012-11-04 LAB — RPR

## 2012-11-04 LAB — HIV ANTIBODY (ROUTINE TESTING W REFLEX): HIV: NONREACTIVE

## 2012-11-04 LAB — LIPID PANEL
HDL: 54 mg/dL (ref >39)
LDL Cholesterol: 66 mg/dL (ref 0–99)
Triglycerides: 115 mg/dL (ref <150)
VLDL: 23 mg/dL (ref 0–40)

## 2012-11-06 LAB — TESTOSTERONE, FREE, TOTAL, SHBG: Sex Hormone Binding: 63 nmol/L (ref 18–114)

## 2013-02-01 ENCOUNTER — Other Ambulatory Visit: Payer: Self-pay

## 2015-08-22 ENCOUNTER — Ambulatory Visit: Payer: Self-pay | Admitting: Primary Care

## 2015-08-22 ENCOUNTER — Encounter: Payer: Self-pay | Admitting: Primary Care

## 2015-08-22 ENCOUNTER — Ambulatory Visit (INDEPENDENT_AMBULATORY_CARE_PROVIDER_SITE_OTHER): Payer: 59 | Admitting: Primary Care

## 2015-08-22 ENCOUNTER — Ambulatory Visit (INDEPENDENT_AMBULATORY_CARE_PROVIDER_SITE_OTHER)
Admission: RE | Admit: 2015-08-22 | Discharge: 2015-08-22 | Disposition: A | Discharge status: Home / Self Care | Payer: 59 | Source: Ambulatory Visit | Attending: Primary Care | Admitting: Primary Care

## 2015-08-22 VITALS — BP 118/82 | HR 48 | Temp 98.1°F | Ht 69.0 in | Wt 241.1 lb

## 2015-08-22 DIAGNOSIS — M25571 Pain in right ankle and joints of right foot: Secondary | ICD-10-CM

## 2015-08-22 NOTE — Progress Notes (Signed)
Subjective:    Patient ID: Linda HoardKristi L Love, female    DOB: 05/23/1975, 40 y.o.   MRN: [id number]  HPI  Ms. Linda Love is a 40 year old female, who is to establish care with Dr. Orlie DakinLetvac next week, who presents today with a chief complaint of ankle pain. Her pain is located to the right ankle and has been present since she fell on 08/10/15. She's fallen numerous times on her right ankle in the past.  She stepped off of the porch into a whole in the grass landing and twisting her right ankle, while in the [place]. She noticed increased swelling and bruising immediately and had to use a wheelchair for several days due to the pain. She then started using one crutch up until Monday this week. Overall she's noticed improvement in her pain, swelling, and bruising, but continues to notice swelling and mild bruising.   She's taken Tramadol and ibuprofen as needed, mostly ibuprofen which she's required less of. She once iced her ankle for several days. Denies numbness/tingling. She would like an xray today to ensure she's not fractured her ankle.   Review of Systems  Musculoskeletal: Positive for arthralgias.  Skin: Positive for color change.  Neurological: Negative for numbness.       Past Medical History  Diagnosis Date  . GERD (gastroesophageal reflux disease)     ??mild LPR symptoms  . History of abnormal Pap smear 2013  . Obesity      Social History   Social History  . Marital Status: Single    Spouse Name: N/A  . Number of Children: N/A  . Years of Education: N/A   Occupational History  . Production assistant, radioales administrator     Cabinet maker   Social History Main Topics  . Smoking status: Former Smoker    Quit date: 10/28/2010  . Smokeless tobacco: Never Used     Comment: has cut down since the cough. Rarely now  . Alcohol Use: Yes     Comment: rarely  . Drug Use: No  . Sexual Activity:    Partners: Male    Birth Control/ Protection: Condom   Other Topics Concern  . Not on file     Social History Narrative    Past Surgical History  Procedure Laterality Date  . Leep  2013    Family History  Problem Relation Age of Onset  . Hypertension Mother   . Cancer Mother     skin cancer  . Alcohol abuse Father   . Heart disease Father   . Atrial fibrillation Father   . Cancer Maternal Aunt     lymphoma  . Atrial fibrillation Paternal Aunt   . Diabetes Paternal Grandmother   . Cancer Cousin 40    Breast    No Known Allergies  Current Outpatient Prescriptions on File Prior to Visit  Medication Sig Dispense Refill  . Multiple Vitamin (MULTIVITAMIN) tablet Take 1 tablet by mouth daily.       No current facility-administered medications on file prior to visit.    BP 118/82 mmHg  Pulse 48  Temp(Src) 98.1 F (36.7 C) (Oral)  Ht 5\' 9"  (1.753 m)  Wt 241 lb 1.9 oz (109.371 kg)  BMI 35.59 kg/m2  SpO2 98%  LMP 08/11/2015    Objective:   Physical Exam  Constitutional: She appears well-nourished.  Cardiovascular: Normal rate and regular rhythm.   Pulmonary/Chest: Effort normal and breath sounds normal.  Musculoskeletal:       Right  ankle: She exhibits decreased range of motion, swelling and ecchymosis. She exhibits no deformity. Tenderness. Medial malleolus tenderness found.  Mild symptoms.  Skin: Skin is warm and dry.  Very mild, well healing bruising to her right medial malleolus. Mild swelling.          Assessment & Plan:  Ankle Pain:  Located to right ankle since fall on 05/14. Overall improved, but continues to experience discomfort. Xray today without evidence of fracture or other abnormality. Provided patient with ASO brace and encouraged ibuprofen, elevation, ice, rest. Follow up PRN.

## 2015-08-22 NOTE — Patient Instructions (Signed)
Complete xray(s) prior to leaving today. I will notify you of your results once received.  Continue Ibuprofen and Tramadol as needed for pain and inflammation.   Use the brace to help with support.  I will be in touch later today.  It was a pleasure to meet you today! Please don't hesitate to call me with any questions. Welcome to [facility]!

## 2015-08-22 NOTE — Progress Notes (Signed)
Pre visit review using our clinic review tool, if applicable. No additional management support is needed unless otherwise documented below in the visit note. 

## 2015-08-29 ENCOUNTER — Ambulatory Visit (INDEPENDENT_AMBULATORY_CARE_PROVIDER_SITE_OTHER): Payer: 59 | Admitting: Internal Medicine

## 2015-08-29 ENCOUNTER — Encounter: Payer: Self-pay | Admitting: Internal Medicine

## 2015-08-29 VITALS — BP 116/70 | HR 76 | Temp 98.3°F | Wt 238.0 lb

## 2015-08-29 DIAGNOSIS — L989 Disorder of the skin and subcutaneous tissue, unspecified: Secondary | ICD-10-CM | POA: Diagnosis not present

## 2015-08-29 DIAGNOSIS — K219 Gastro-esophageal reflux disease without esophagitis: Secondary | ICD-10-CM

## 2015-08-29 DIAGNOSIS — Z0000 Encounter for general adult medical examination without abnormal findings: Secondary | ICD-10-CM | POA: Diagnosis not present

## 2015-08-29 LAB — COMPREHENSIVE METABOLIC PANEL
ALT: 12 U/L (ref 0–35)
AST: 15 U/L (ref 0–37)
Albumin: 4.2 g/dL (ref 3.5–5.2)
Alkaline Phosphatase: 51 U/L (ref 39–117)
BUN: 18 mg/dL (ref 6–23)
CHLORIDE: 105 meq/L (ref 96–112)
CO2: 26 meq/L (ref 19–32)
CREATININE: 0.72 mg/dL (ref 0.40–1.20)
Calcium: 9.3 mg/dL (ref 8.4–10.5)
GFR: 95.72 mL/min (ref >60.00)
Glucose, Bld: 100 mg/dL — ABNORMAL HIGH (ref 70–99)
Potassium: 4.5 mEq/L (ref 3.5–5.1)
SODIUM: 139 meq/L (ref 135–145)
Total Bilirubin: 0.4 mg/dL (ref 0.2–1.2)
Total Protein: 7.2 g/dL (ref 6.0–8.3)

## 2015-08-29 LAB — CBC WITH DIFFERENTIAL/PLATELET
BASOS ABS: 0.1 10*3/uL (ref 0.0–0.1)
Basophils Relative: 0.7 % (ref 0.0–3.0)
EOS ABS: 0.4 10*3/uL (ref 0.0–0.7)
Eosinophils Relative: 4.8 % (ref 0.0–5.0)
HCT: 41.7 % (ref 36.0–46.0)
Hemoglobin: 13.8 g/dL (ref 12.0–15.0)
LYMPHS ABS: 1.7 10*3/uL (ref 0.7–4.0)
Lymphocytes Relative: 20 % (ref 12.0–46.0)
MCHC: 33.2 g/dL (ref 30.0–36.0)
MCV: 91.9 fl (ref 78.0–100.0)
Monocytes Absolute: 0.5 10*3/uL (ref 0.1–1.0)
Monocytes Relative: 5.6 % (ref 3.0–12.0)
NEUTROS ABS: 5.9 10*3/uL (ref 1.4–7.7)
NEUTROS PCT: 68.9 % (ref 43.0–77.0)
PLATELETS: 288 10*3/uL (ref 150.0–400.0)
RBC: 4.53 Mil/uL (ref 3.87–5.11)
RDW: 13.3 % (ref 11.5–15.5)
WBC: 8.6 10*3/uL (ref 4.0–10.5)

## 2015-08-29 LAB — T4, FREE: Free T4: 0.82 ng/dL (ref 0.60–1.60)

## 2015-08-29 NOTE — Patient Instructions (Signed)
DASH Eating Plan  DASH stands for "Dietary Approaches to Stop Hypertension." The DASH eating plan is a healthy eating plan that has been shown to reduce high blood pressure (hypertension). Additional health benefits may include reducing the risk of type 2 diabetes mellitus, heart disease, and stroke. The DASH eating plan may also help with weight loss.  WHAT DO I NEED TO KNOW ABOUT THE DASH EATING PLAN?  For the DASH eating plan, you will follow these general guidelines:  · Choose foods with a percent daily value for sodium of less than 5% (as listed on the food label).  · Use salt-free seasonings or herbs instead of table salt or sea salt.  · Check with your health care provider or pharmacist before using salt substitutes.  · Eat lower-sodium products, often labeled as "lower sodium" or "no salt added."  · Eat fresh foods.  · Eat more vegetables, fruits, and low-fat dairy products.  · Choose whole grains. Look for the word "whole" as the first word in the ingredient list.  · Choose fish and skinless chicken or turkey more often than red meat. Limit fish, poultry, and meat to 6 oz (170 g) each day.  · Limit sweets, desserts, sugars, and sugary drinks.  · Choose heart-healthy fats.  · Limit cheese to 1 oz (28 g) per day.  · Eat more home-cooked food and less restaurant, buffet, and fast food.  · Limit fried foods.  · Cook foods using methods other than frying.  · Limit canned vegetables. If you do use them, rinse them well to decrease the sodium.  · When eating at a restaurant, ask that your food be prepared with less salt, or no salt if possible.  WHAT FOODS CAN I EAT?  Seek help from a dietitian for individual calorie needs.  Grains  Whole grain or whole wheat bread. Brown rice. Whole grain or whole wheat pasta. Quinoa, bulgur, and whole grain cereals. Low-sodium cereals. Corn or whole wheat flour tortillas. Whole grain cornbread. Whole grain crackers. Low-sodium crackers.  Vegetables  Fresh or frozen vegetables  (raw, steamed, roasted, or grilled). Low-sodium or reduced-sodium tomato and vegetable juices. Low-sodium or reduced-sodium tomato sauce and paste. Low-sodium or reduced-sodium canned vegetables.   Fruits  All fresh, canned (in natural juice), or frozen fruits.  Meat and Other Protein Products  Ground beef (85% or leaner), grass-fed beef, or beef trimmed of fat. Skinless chicken or turkey. Ground chicken or turkey. Pork trimmed of fat. All fish and seafood. Eggs. Dried beans, peas, or lentils. Unsalted nuts and seeds. Unsalted canned beans.  Dairy  Low-fat dairy products, such as skim or 1% milk, 2% or reduced-fat cheeses, low-fat ricotta or cottage cheese, or plain low-fat yogurt. Low-sodium or reduced-sodium cheeses.  Fats and Oils  Tub margarines without trans fats. Light or reduced-fat mayonnaise and salad dressings (reduced sodium). Avocado. Safflower, olive, or canola oils. Natural peanut or almond butter.  Other  Unsalted popcorn and pretzels.  The items listed above may not be a complete list of recommended foods or beverages. Contact your dietitian for more options.  WHAT FOODS ARE NOT RECOMMENDED?  Grains  White bread. White pasta. White rice. Refined cornbread. Bagels and croissants. Crackers that contain trans fat.  Vegetables  Creamed or fried vegetables. Vegetables in a cheese sauce. Regular canned vegetables. Regular canned tomato sauce and paste. Regular tomato and vegetable juices.  Fruits  Dried fruits. Canned fruit in light or heavy syrup. Fruit juice.  Meat and Other Protein   Products  Fatty cuts of meat. Ribs, chicken wings, bacon, sausage, bologna, salami, chitterlings, fatback, hot dogs, bratwurst, and packaged luncheon meats. Salted nuts and seeds. Canned beans with salt.  Dairy  Whole or 2% milk, cream, half-and-half, and cream cheese. Whole-fat or sweetened yogurt. Full-fat cheeses or blue cheese. Nondairy creamers and whipped toppings. Processed cheese, cheese spreads, or cheese  curds.  Condiments  Onion and garlic salt, seasoned salt, table salt, and sea salt. Canned and packaged gravies. Worcestershire sauce. Tartar sauce. Barbecue sauce. Teriyaki sauce. Soy sauce, including reduced sodium. Steak sauce. Fish sauce. Oyster sauce. Cocktail sauce. Horseradish. Ketchup and mustard. Meat flavorings and tenderizers. Bouillon cubes. Hot sauce. Tabasco sauce. Marinades. Taco seasonings. Relishes.  Fats and Oils  Butter, stick margarine, lard, shortening, ghee, and bacon fat. Coconut, palm kernel, or palm oils. Regular salad dressings.  Other  Pickles and olives. Salted popcorn and pretzels.  The items listed above may not be a complete list of foods and beverages to avoid. Contact your dietitian for more information.  WHERE CAN I FIND MORE INFORMATION?  National Heart, Lung, and Blood Institute: www.nhlbi.nih.gov/health/health-topics/topics/dash/     This information is not intended to replace advice given to you by your health care provider. Make sure you discuss any questions you have with your health care provider.     Document Released: 03/04/2011 Document Revised: 04/05/2014 Document Reviewed: 01/17/2013  Elsevier Interactive Patient Education ©2016 [employer]

## 2015-08-29 NOTE — Progress Notes (Signed)
Pre visit review using our clinic review tool, if applicable. No additional management support is needed unless otherwise documented below in the visit note. 

## 2015-08-29 NOTE — Assessment & Plan Note (Signed)
This has been quiet Will check labs 

## 2015-08-29 NOTE — Assessment & Plan Note (Signed)
Needs to work on fitness Td due next year

## 2015-08-29 NOTE — Assessment & Plan Note (Signed)
Suspicious on lip Multiple nevi so should start with derm

## 2015-08-29 NOTE — Progress Notes (Signed)
Subjective:    Patient ID: Linda HoardKristi L Riemenschneider, female    DOB: 03/08/1976, 40 y.o.   MRN: [id number]  HPI Here to reestablish Didn't have insurance--now has some Went to [facility]---had normal pap last fall Has had some acute viral infections as well--like tonsillitis, ruptured OM etc Did have follow up at [facility]  No recent problems with acid reflux  No throat symptoms, hoarseness, etc Not drinking as much coffee  Right ankle is better Bruising cleared up Still some pain above medial malleolus Using brace for the most part  Current Outpatient Prescriptions on File Prior to Visit  Medication Sig Dispense Refill  . Multiple Vitamin (MULTIVITAMIN) tablet Take 1 tablet by mouth daily.       No current facility-administered medications on file prior to visit.    No Known Allergies  Past Medical History  Diagnosis Date  . GERD (gastroesophageal reflux disease)     ??mild LPR symptoms  . History of abnormal Pap smear 2013  . Obesity     Past Surgical History  Procedure Laterality Date  . Leep  2013    Family History  Problem Relation Age of Onset  . Hypertension Mother   . Cancer Mother     skin cancer  . Alcohol abuse Father   . Heart disease Father   . Atrial fibrillation Father   . Cancer Maternal Aunt     lymphoma  . Atrial fibrillation Paternal Aunt   . Diabetes Paternal Grandmother   . Cancer Cousin [phone]    Breast    Social History   Social History  . Marital Status: Single    Spouse Name: N/A  . Number of Children: 0  . Years of Education: N/A   Occupational History  . Office manager     [employer]   Social History Main Topics  . Smoking status: Former Smoker    Quit date: 10/28/2010  . Smokeless tobacco: Never Used     Comment: has cut down since the cough. Rarely now  . Alcohol Use: Yes     Comment: rarely  . Drug Use: No  . Sexual Activity:    Partners: Male    Birth Control/ Protection: Condom   Other  Topics Concern  . Not on file   Social History Narrative   Review of Systems  Constitutional: Negative for fatigue.       No exercise--plans to start Weight stable Wears seat belt  HENT: Negative for hearing loss and tinnitus.        Overdue for dentist--has minor ache  Eyes: Negative for visual disturbance.       No recent exam--slight changes No diplopia or unilateral  Respiratory: Negative for cough, chest tightness and shortness of breath.   Cardiovascular: Negative for chest pain and leg swelling.       Palpitation once due to work stress  Gastrointestinal: Negative for nausea, vomiting and constipation.       Rarely saw red blood on toilet paper---thinks she has small hemorrhoid. Not recently  Endocrine: Negative for polydipsia and polyuria.  Genitourinary: Negative for dysuria, hematuria and dyspareunia.       Periods are regular--no trouble with this  Musculoskeletal: Negative for back pain, joint swelling and arthralgias.  Skin: Negative for rash.       Brown spot on lip  Allergic/Immunologic: Negative for environmental allergies and immunocompromised state.  Neurological: Negative for dizziness, syncope, weakness, light-headedness and headaches.  Hematological: Negative for  adenopathy. Does not bruise/bleed easily.  Psychiatric/Behavioral: Negative for sleep disturbance and dysphoric mood. The patient is not nervous/anxious.        Objective:   Physical Exam  Constitutional: She is oriented to person, place, and time. She appears well-developed and well-nourished. No distress.  HENT:  Head: Normocephalic and atraumatic.  Right Ear: External ear normal.  Left Ear: External ear normal.  Mouth/Throat: Oropharynx is clear and moist. No oropharyngeal exudate.  Eyes: Conjunctivae are normal. Pupils are equal, round, and reactive to light.  Neck: Normal range of motion. Neck supple.  Palpable thyroid without clear nodules or enlargement  Cardiovascular: Normal rate,  regular rhythm, normal heart sounds and intact distal pulses.  Exam reveals no gallop.   No murmur heard. Pulmonary/Chest: Effort normal and breath sounds normal. No respiratory distress. She has no wheezes. She has no rales.  Abdominal: Soft. There is no tenderness.  Musculoskeletal: She exhibits no edema or tenderness.  Lymphadenopathy:    She has no cervical adenopathy.  Neurological: She is alert and oriented to person, place, and time.  Skin:  Many pigmented lesions 1 x 3mm lesion on right lip  Psychiatric: She has a normal mood and affect. Her behavior is normal.          Assessment & Plan:

## 2016-01-19 ENCOUNTER — Ambulatory Visit: Payer: 59 | Admitting: Primary Care

## 2016-01-19 ENCOUNTER — Encounter: Payer: Self-pay | Admitting: Primary Care

## 2016-01-19 ENCOUNTER — Ambulatory Visit (INDEPENDENT_AMBULATORY_CARE_PROVIDER_SITE_OTHER): Payer: 59 | Admitting: Primary Care

## 2016-01-19 VITALS — BP 118/82 | HR 67 | Temp 97.7°F | Ht 69.0 in | Wt 235.0 lb

## 2016-01-19 DIAGNOSIS — J020 Streptococcal pharyngitis: Secondary | ICD-10-CM | POA: Diagnosis not present

## 2016-01-19 LAB — POCT RAPID STREP A (OFFICE): RAPID STREP A SCREEN: POSITIVE — AB

## 2016-01-19 MED ORDER — AMOXICILLIN 875 MG PO TABS: 875.0000 mg | ORAL_TABLET | Freq: Two times a day (BID) | ORAL | 0 refills | Status: DC

## 2016-01-19 NOTE — Addendum Note (Signed)
Addended by: Tawnya CrookSAMBATH, Kriya Westra on: 01/19/2016 12:16 PM   Modules accepted: Orders

## 2016-01-19 NOTE — Patient Instructions (Signed)
Your strep test was positive.  Start amoxicillin antibiotics. Take 1 tablet by mouth twice daily for 7 days.  You may also take ibuprofen 600 mg three times daily for pain and inflammation. Consider warm, salt gargles and/or throat lozenges for pain.  Ensure you are staying hydrated with water.  Please notify me if no improvement in 3-4 days.  It was a pleasure meeting you!

## 2016-01-19 NOTE — Progress Notes (Signed)
Subjective:    Patient ID: Linda Love, female    DOB: 03/30/1975, 40 y.o.   MRN: [id number]  HPI  Ms. Linda Love is a 40 year old female who presents today with a chief complaint of sore throat. She also reports swelling to her lymph nodes, post nasal drip, and pressure to her ears. Her symptoms began on October 13th. She did notice white spots to her posterior pharynx with fevers on October 14th that lingered for the next several days. She's taken ibuprofen, EmergenC, and Flonase with some improvement. Overall she's feeling better, but continues to experience sore throat, some white spots to her throat, and post nasal drip. She denies cough, recent fevers, palpitations, swelling to her throat.  Review of Systems  Constitutional: Positive for chills and fatigue. Negative for fever.  HENT: Positive for ear pain, postnasal drip and sore throat. Negative for congestion, sinus pressure, sneezing and trouble swallowing.   Respiratory: Negative for cough and shortness of breath.        Past Medical History:  Diagnosis Date  . GERD (gastroesophageal reflux disease)    ??mild LPR symptoms  . History of abnormal Pap smear 2013  . Obesity      Social History   Social History  . Marital status: Single    Spouse name: N/A  . Number of children: 0  . Years of education: N/A   Occupational History  . Office manager     [employer]   Social History Main Topics  . Smoking status: Former Smoker    Quit date: 10/28/2010  . Smokeless tobacco: Never Used     Comment: has cut down since the cough. Rarely now  . Alcohol use Yes     Comment: rarely  . Drug use: No  . Sexual activity: Yes    Partners: Male    Birth control/ protection: Condom   Other Topics Concern  . Not on file   Social History Narrative  . No narrative on file    Past Surgical History:  Procedure Laterality Date  . LEEP  2013    Family History  Problem Relation Age of Onset  . Hypertension Mother    . Cancer Mother     skin cancer  . Alcohol abuse Father   . Heart disease Father   . Atrial fibrillation Father   . Cancer Maternal Aunt     lymphoma  . Atrial fibrillation Paternal Aunt   . Diabetes Paternal Grandmother   . Cancer Cousin 40    Breast    No Known Allergies  Current Outpatient Prescriptions on File Prior to Visit  Medication Sig Dispense Refill  . ibuprofen (ADVIL,MOTRIN) 200 MG tablet Take 400 mg by mouth.    . Multiple Vitamin (MULTIVITAMIN) tablet Take 1 tablet by mouth daily.       No current facility-administered medications on file prior to visit.     BP 118/82   Pulse 67   Temp 97.7 F (36.5 C) (Oral)   Ht 5\' 9"  (1.753 m)   Wt 235 lb (106.6 kg)   LMP 01/19/2016   SpO2 98%   BMI 34.70 kg/m    Objective:   Physical Exam  Constitutional: She appears well-nourished.  HENT:  Right Ear: Tympanic membrane and ear canal normal.  Left Ear: Tympanic membrane and ear canal normal.  Nose: Right sinus exhibits no maxillary sinus tenderness and no frontal sinus tenderness. Left sinus exhibits no maxillary sinus tenderness and no frontal  sinus tenderness.  Mouth/Throat: Oropharyngeal exudate, posterior oropharyngeal edema and posterior oropharyngeal erythema present.  Eyes: Conjunctivae are normal.  Neck: Neck supple.  Cardiovascular: Normal rate and regular rhythm.   Pulmonary/Chest: Effort normal and breath sounds normal. She has no wheezes. She has no rales.  Lymphadenopathy:    She has no cervical adenopathy.  Skin: Skin is warm and dry.          Assessment & Plan:  Strep Pharyngitis:   Sore throat with exudates and fatigue over 1 week. Vitals stable today, does not appear toxic. Exam today with erythema, exudates, edema. Rapid Strep: Positive Rx for Amoxicillin 875 mg course x 7 days. Discussed warm salt gargles, chloraseptic spray, throat lozenges.  Follow up PRN.  Morrie Sheldon, NP

## 2016-01-19 NOTE — Progress Notes (Signed)
Pre visit review using our clinic review tool, if applicable. No additional management support is needed unless otherwise documented below in the visit note. 

## 2016-12-28 IMAGING — DX DG ANKLE COMPLETE 3+V*R*
3 series · 3 of 3 positions shown · non-contrast
Comparison: None.

CLINICAL DATA: Persistent right ankle pain since fall 08/10/2015.

EXAM:
RIGHT ANKLE - COMPLETE 3+ VIEW

[ankle ap]
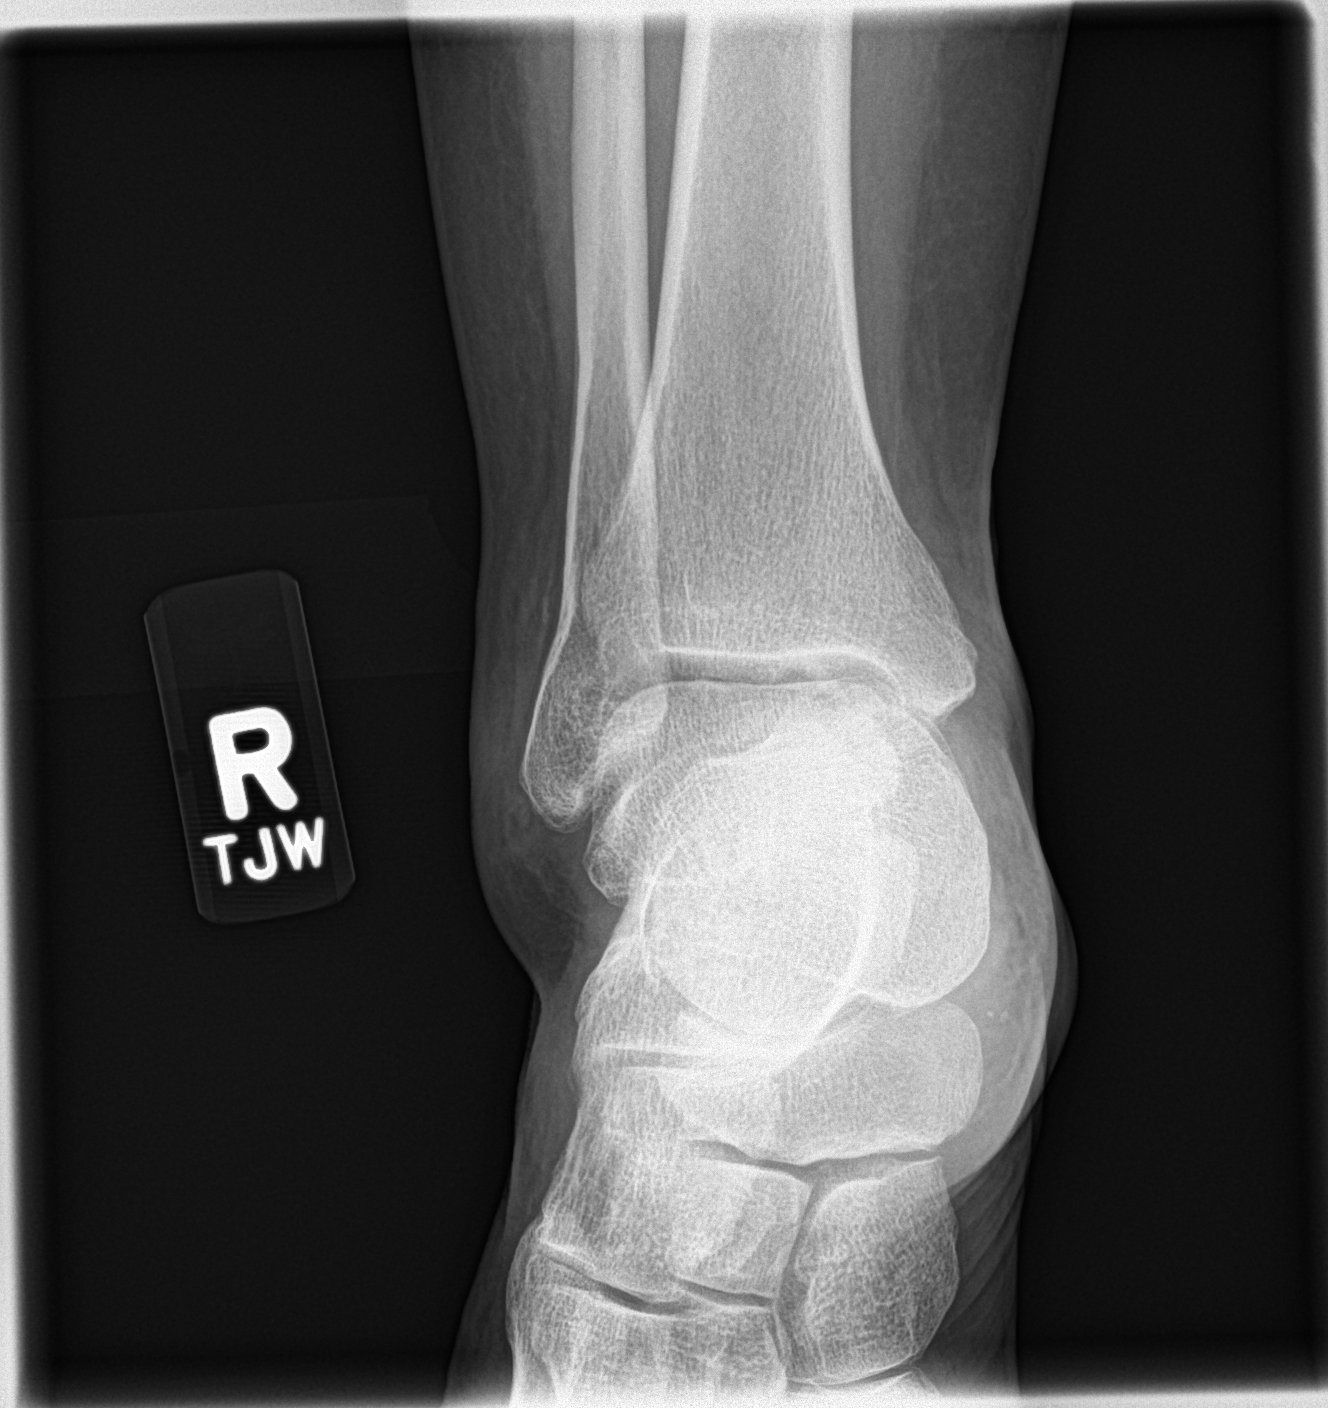

[ankle obl]
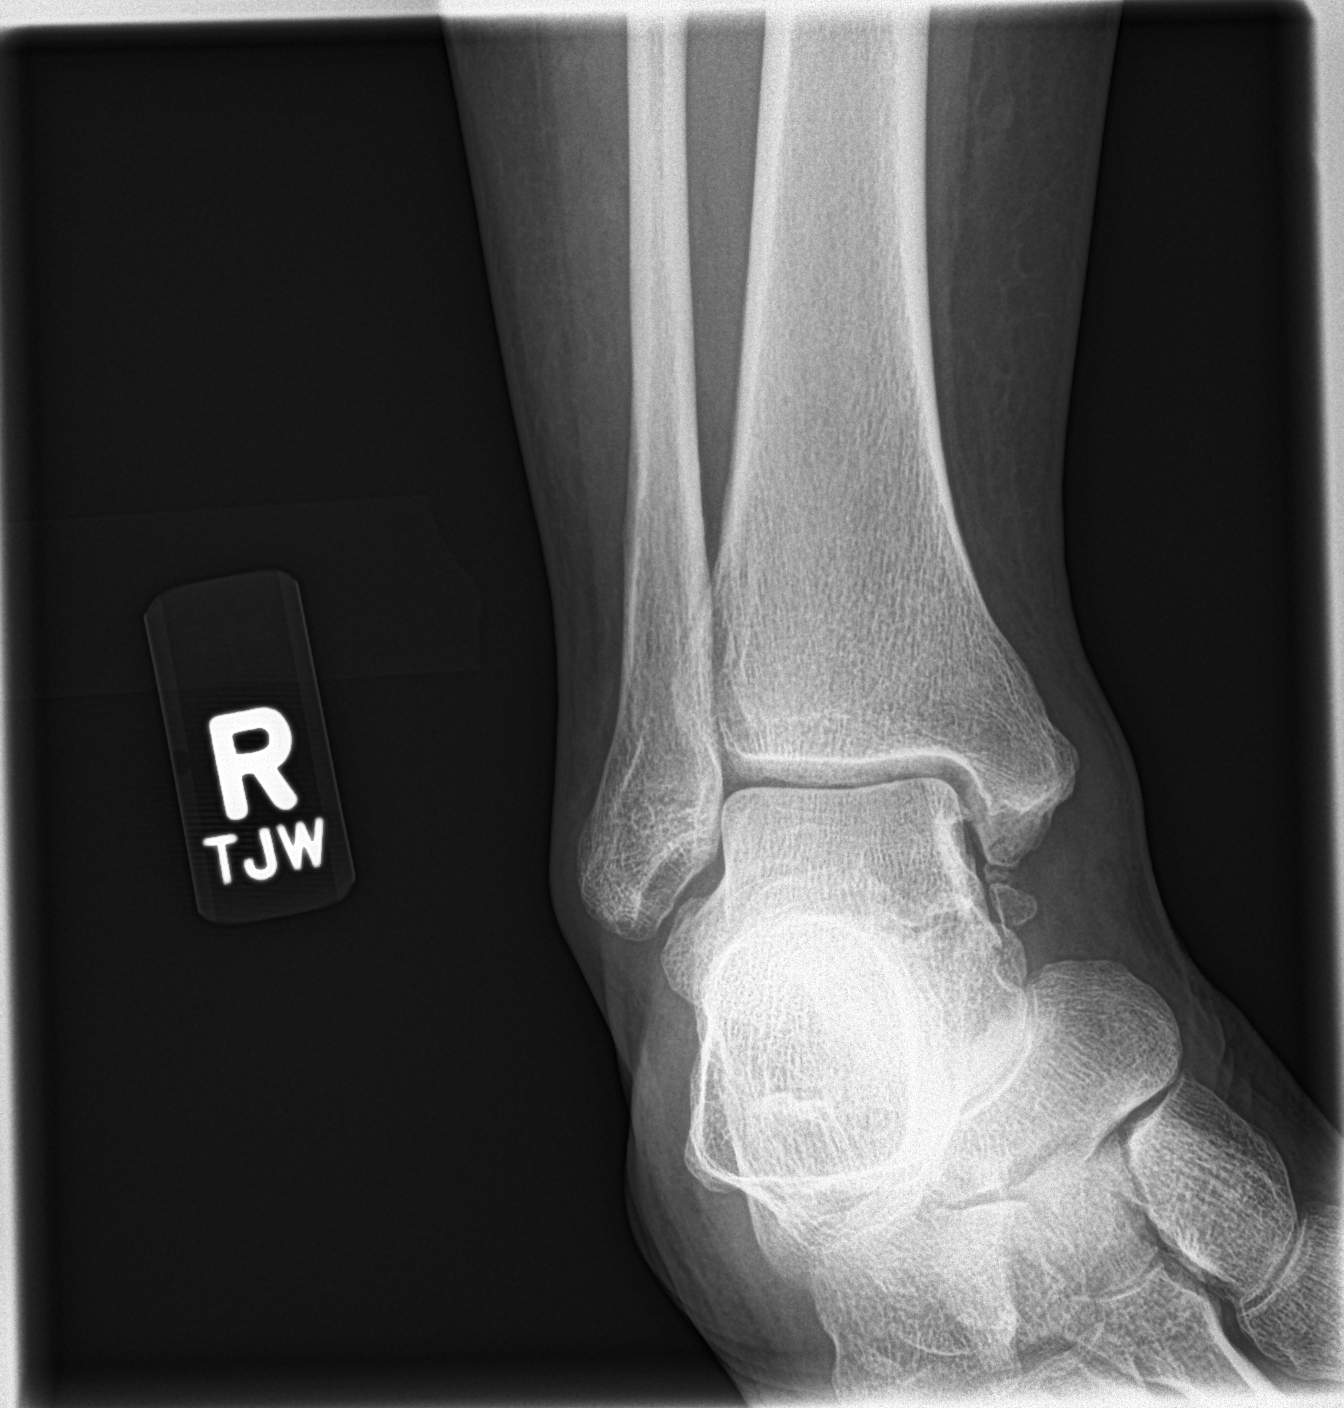

[ankle lat]
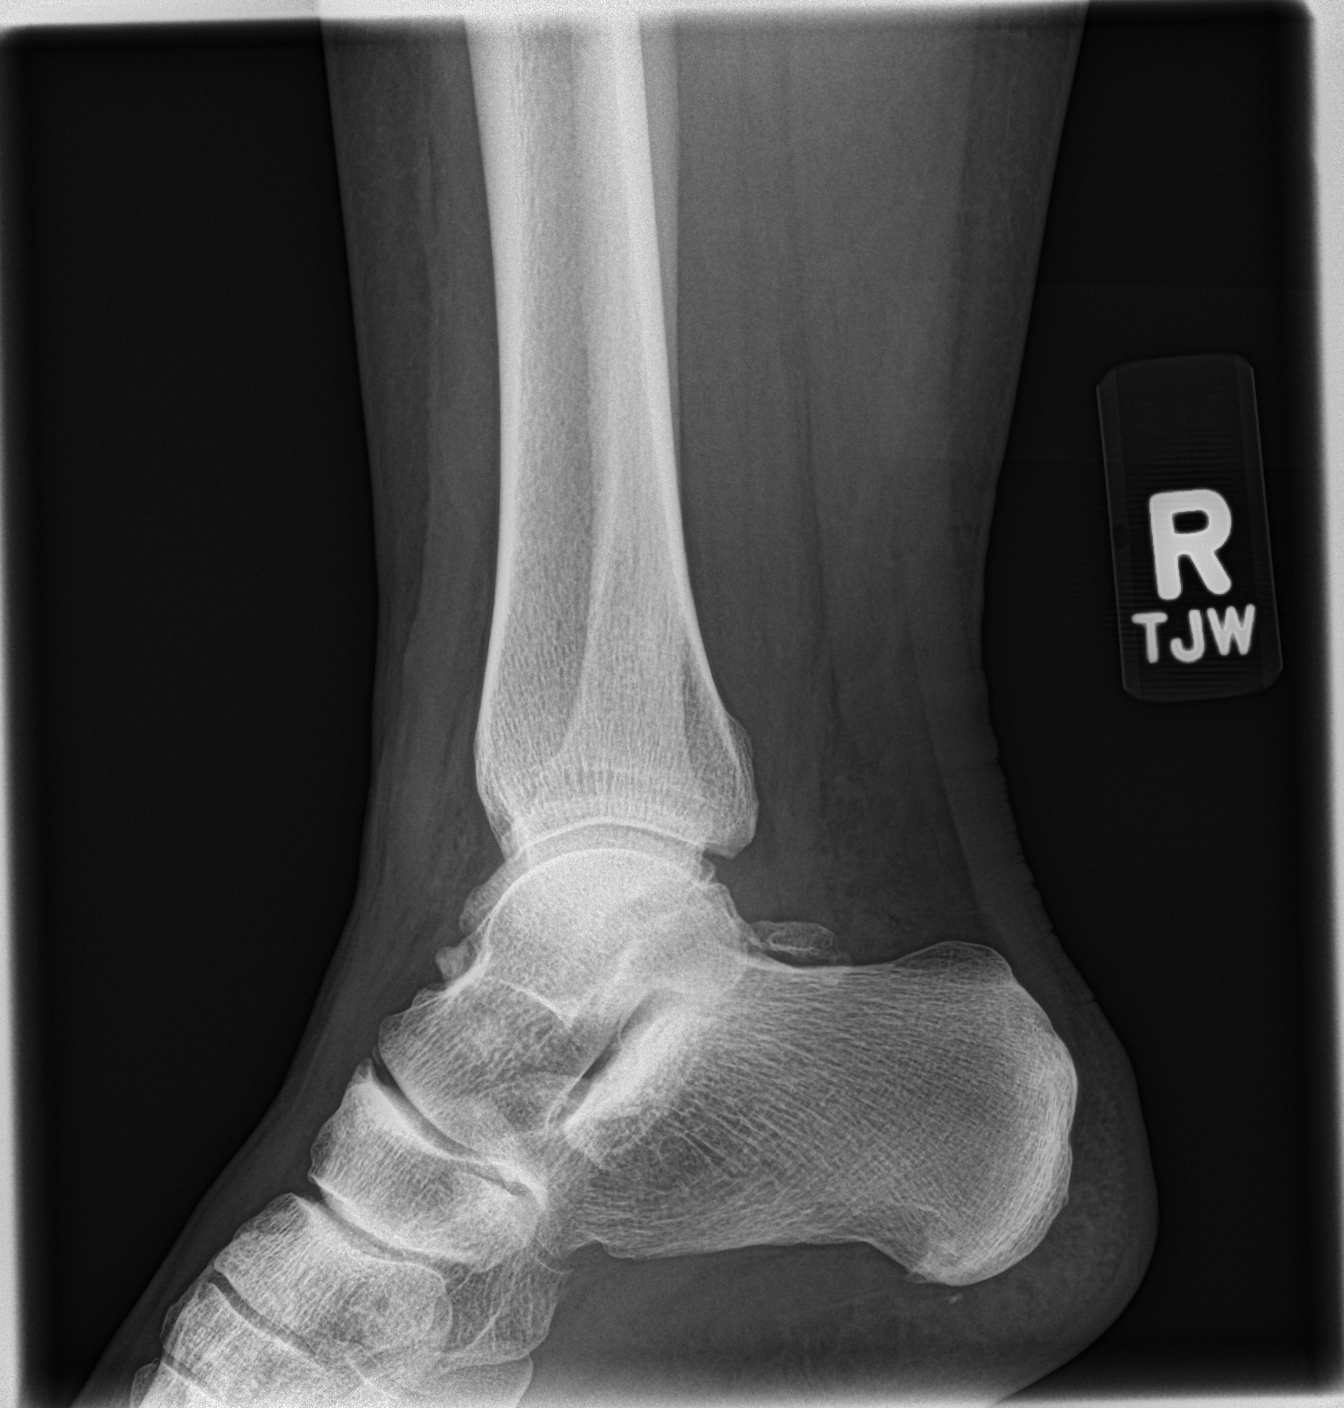

[3 of 3 positions shown; findings below may reference images not displayed]

FINDINGS: Mild soft tissue swelling. Ankle mortise is within normal. 2 well
corticated small bony fragments distal to the medial malleolus. No
acute fracture or dislocation. Os trigonum. Tiny inferior calcaneal
spur.
IMPRESSION: No acute fracture or dislocation.

## 2021-08-28 ENCOUNTER — Telehealth: Payer: BC Managed Care – PPO | Admitting: Physician Assistant

## 2021-08-28 DIAGNOSIS — A084 Viral intestinal infection, unspecified: Secondary | ICD-10-CM

## 2021-08-28 MED ORDER — ONDANSETRON 4 MG PO TBDP: 4.0000 mg | ORAL_TABLET | Freq: Three times a day (TID) | ORAL | 0 refills | Status: DC | PRN

## 2021-08-28 NOTE — Progress Notes (Signed)
Virtual Visit Consent   Linda Love, you are scheduled for a virtual visit with a [facility] provider today. Just as with appointments in the office, your consent must be obtained to participate. Your consent will be active for this visit and any virtual visit you may have with one of our providers in the next 365 days. If you have a MyChart account, a copy of this consent can be sent to you electronically.  As this is a virtual visit, video technology does not allow for your provider to perform a traditional examination. This may limit your provider's ability to fully assess your condition. If your provider identifies any concerns that need to be evaluated in person or the need to arrange testing (such as labs, EKG, etc.), we will make arrangements to do so. Although advances in technology are sophisticated, we cannot ensure that it will always work on either your end or our end. If the connection with a video visit is poor, the visit may have to be switched to a telephone visit. With either a video or telephone visit, we are not always able to ensure that we have a secure connection.  By engaging in this virtual visit, you consent to the provision of healthcare and authorize for your insurance to be billed (if applicable) for the services provided during this visit. Depending on your insurance coverage, you may receive a charge related to this service.  I need to obtain your verbal consent now. Are you willing to proceed with your visit today? Linda Love has provided verbal consent on 08/28/2021 for a virtual visit (video or telephone). Margaretann Loveless, PA-C  Date: 08/28/2021 4:16 PM  Virtual Visit via Video Note   I, Margaretann Loveless, connected with  Linda Love  ([id number], 06-25-1975) on 08/28/21 at  4:00 PM EDT by a video-enabled telemedicine application and verified that I am speaking with the correct person using two identifiers.  Location: Patient: Virtual Visit Location  Patient: Home Provider: Virtual Visit Location Provider: Home Office   I discussed the limitations of evaluation and management by telemedicine and the availability of in person appointments. The patient expressed understanding and agreed to proceed.    History of Present Illness: Linda Love is a 46 y.o. who identifies as a female who was assigned female at birth, and is being seen today for nausea, vomiting and headache.  HPI: Emesis  This is a new problem. The current episode started today. The problem occurs 5 to 10 times per day. The problem has been unchanged. The emesis has an appearance of bile and stomach contents. Maximum temperature: subjective fevers. The fever has been present for Less than 1 day. Associated symptoms include abdominal pain, chills, headaches, myalgias and sweats. Risk factors include ill contacts. She has tried increased fluids (promethazine) for the symptoms. The treatment provided mild relief.     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  , Disp: , Rfl:   Observations/Objective: Patient is well-developed, well-nourished in no acute distress.  Resting comfortably at  home.  Head is normocephalic, atraumatic.  No labored breathing.  Speech is clear and coherent with logical content.  Patient is alert and oriented at baseline.    Assessment and Plan: 1. Viral gastroenteritis - ondansetron (ZOFRAN-ODT) 4 MG disintegrating tablet; Take 1 tablet (4 mg total) by mouth every 8 (eight) hours as needed for nausea or vomiting.  Dispense: 20 tablet; Refill: 0  - Suspect viral gastroenteritis - Zofran for nausea - Push fluids, electrolyte beverages - Liquid diet, then increase to soft/bland (BRAT) diet over next day, then increase diet as tolerated - Seek in person evaluation if not improving or symptoms worsen   Follow Up Instructions: I discussed the assessment and treatment plan with the patient. The patient was provided an opportunity to ask questions and all were answered. The patient agreed with the plan and demonstrated an understanding of the instructions.  A copy of instructions were sent to the patient via MyChart unless otherwise noted below.    The patient was advised to call back or seek an in-person evaluation if the symptoms worsen or if the condition fails to improve as anticipated.  Time:  I spent 12 minutes with the patient via telehealth technology discussing the above problems/concerns.    Margaretann Loveless, PA-C

## 2021-08-28 NOTE — Patient Instructions (Signed)
Linda Love, thank you for joining Margaretann Loveless, PA-C for today's virtual visit.  While this provider is not your primary care provider (PCP), if your PCP is located in our provider database this encounter information will be shared with them immediately following your visit.  Consent: (Patient) Linda Love provided verbal consent for this virtual visit at the beginning of the encounter.  Current Medications:  Current Outpatient Medications:    ondansetron (ZOFRAN-ODT) 4 MG disintegrating tablet, Take 1 tablet (4 mg total) by mouth every 8 (eight) hours as needed for nausea or vomiting., Disp: 20 tablet, Rfl: 0   amoxicillin (AMOXIL) 875 MG tablet, Take 1 tablet (875 mg total) by mouth 2 (two) times daily., Disp: 14 tablet, Rfl: 0   ibuprofen (ADVIL,MOTRIN) 200 MG tablet, Take 400 mg by mouth., Disp: , Rfl:    Multiple Vitamin (MULTIVITAMIN) tablet, Take 1 tablet by mouth daily.  , Disp: , Rfl:    Medications ordered in this encounter:  Meds ordered this encounter  Medications   ondansetron (ZOFRAN-ODT) 4 MG disintegrating tablet    Sig: Take 1 tablet (4 mg total) by mouth every 8 (eight) hours as needed for nausea or vomiting.    Dispense:  20 tablet    Refill:  0    Order Specific Question:   Supervising Provider    Answer:   Hyacinth Meeker, BRIAN [[id number]]     *If you need refills on other medications prior to your next appointment, please contact your pharmacy*  Follow-Up: Call back or seek an in-person evaluation if the symptoms worsen or if the condition fails to improve as anticipated.  Other Instructions Viral Gastroenteritis, Adult  Viral gastroenteritis is also known as the stomach flu. This condition may affect your stomach, your small intestine, and your large intestine. It can cause sudden watery poop (diarrhea), fever, and vomiting. This condition is caused by certain germs (viruses). These germs can be passed from person to person very easily (are  contagious). Having watery poop and vomiting can make you feel weak and cause you to not have enough water in your body (get dehydrated). This can make you tired and thirsty, make you have a dry mouth, and make it so you pee (urinate) less often. It is important to replace the fluids that you lose from having watery poop and vomiting. What are the causes? You can get sick by catching germs from other people. You can also get sick by: Eating food, drinking water, or touching a surface that has the germs on it (is contaminated). Sharing utensils or other personal items with a person who is sick. What increases the risk? Having a weak body defense system (immune system). Living with one or more children who are younger than 2 years. Living in a nursing home. Going on cruise ships. What are the signs or symptoms? Symptoms of this condition start suddenly. Symptoms may last for a few days or for as long as a week. Common symptoms include: Watery poop. Vomiting. Other symptoms include: Fever. Headache. Feeling tired (fatigue). Pain in the belly (abdomen). Chills. Feeling weak. Feeling like you may vomit (nauseous). Muscle aches. Not feeling hungry. How is this treated? This condition typically goes away on its own. The focus of treatment is to replace the fluids that you lose. This condition may be treated with: An ORS (oral rehydration solution). This is a drink that helps you replace fluids and minerals your body lost. It is sold at pharmacies and stores. Medicines  to help with your symptoms. Probiotic supplements to reduce symptoms of watery poop. Fluids given through an IV tube, if needed. Older adults and people with other diseases or a weak body defense system are at higher risk for not having enough water in the body. Follow these instructions at home: Eating and drinking  Take an ORS as told by your doctor. Drink clear fluids in small amounts as you are able. Clear fluids  include: Water. Ice chips. Fruit juice that has water added to it (is diluted). Low-calorie sports drinks. Drink enough fluid to keep your pee (urine) pale yellow. Eat small amounts of healthy foods every 3-4 hours as you are able. This may include whole grains, fruits, vegetables, lean meats, and yogurt. Avoid fluids that have a lot of sugar or caffeine in them. This includes energy drinks, sports drinks, and soda. Avoid spicy or fatty foods. Avoid alcohol. General instructions  Wash your hands often. This is very important after you have watery poop or you vomit. If you cannot use soap and water, use hand sanitizer. Make sure that all people in your home wash their hands well and often. Take over-the-counter and prescription medicines only as told by your doctor. Rest at home while you get better. Watch your condition for any changes. Take a warm bath to help with any burning or pain from having watery poop. Keep all follow-up visits. Contact a doctor if: You cannot keep fluids down. Your symptoms get worse. You have new symptoms. You feel light-headed or dizzy. You have muscle cramps. Get help right away if: You have chest pain. You have trouble breathing, or you are breathing very fast. You have a fast heartbeat. You feel very weak or you faint. You have a very bad headache, a stiff neck, or both. You have a rash. You have very bad pain, cramping, or bloating in your belly. Your skin feels cold and clammy. You feel mixed up (confused). You have pain when you pee. You have signs of not having enough water in the body, such as: Dark pee, hardly any pee, or no pee. Cracked lips. Dry mouth. Sunken eyes. Feeling very sleepy. Feeling weak. You have signs of bleeding, such as: You see blood in your vomit. Your vomit looks like coffee grounds. You have bloody or black poop or poop that looks like tar. These symptoms may be an emergency. Get help right away. Call 911. Do  not wait to see if the symptoms will go away. Do not drive yourself to the hospital. Summary Viral gastroenteritis is also known as the stomach flu. This condition can cause sudden watery poop (diarrhea), fever, and vomiting. These germs can be passed from person to person very easily. Take an ORS (oral rehydration solution) as told by your doctor. This is a drink that is sold at pharmacies and stores. Wash your hands often, especially after having watery poop or vomiting. If you cannot use soap and water, use hand sanitizer. This information is not intended to replace advice given to you by your health care provider. Make sure you discuss any questions you have with your health care provider. Document Revised: 01/12/2021 Document Reviewed: 01/12/2021 Elsevier Patient Education  2023 Elsevier Inc.    If you have been instructed to have an in-person evaluation today at a local Urgent Care facility, please use the link below. It will take you to a list of all of our available [facility], including address, phone number and hours of operation.  Please do not delay care.  [facility]  If you or a family member do not have a primary care provider, use the link below to schedule a visit and establish care. When you choose a [facility] primary care physician or advanced practice provider, you gain a long-term partner in health. Find a Primary Care Provider  Learn more about 's in-office and virtual care options: [facility]

## 2021-09-21 DIAGNOSIS — M545 Low back pain, unspecified: Secondary | ICD-10-CM | POA: Diagnosis not present

## 2021-10-07 ENCOUNTER — Ambulatory Visit: Payer: Self-pay | Admitting: Adult Health

## 2021-10-07 VITALS — BP 140/58 | HR 88

## 2021-10-07 DIAGNOSIS — R030 Elevated blood-pressure reading, without diagnosis of hypertension: Secondary | ICD-10-CM

## 2021-10-07 NOTE — Progress Notes (Signed)
Therapist, [facility] [address] [place], [place] [address]   Office Visit Note  Patient Name: Linda Love Date of Birth [id number]  Medical Record number [id number]  Date of Service: 10/07/2021  Chief Complaint  Patient presents with   Headache    BP check     HPI Pt is here for a sick visit. Patient reports she has been feeling "off" for a day or two.  She reports headache.  She reports her diet has been worse lately, and she does not exercise.  She has multiple family members with high blood pressure.  She does not currently take any medications.    Current Medication:  Outpatient Encounter Medications as of 10/07/2021  Medication Sig   amoxicillin (AMOXIL) 875 MG tablet Take 1 tablet (875 mg total) by mouth 2 (two) times daily.   ibuprofen (ADVIL,MOTRIN) 200 MG tablet Take 400 mg by mouth.   Multiple Vitamin (MULTIVITAMIN) tablet Take 1 tablet by mouth daily.     ondansetron (ZOFRAN-ODT) 4 MG disintegrating tablet Take 1 tablet (4 mg total) by mouth every 8 (eight) hours as needed for nausea or vomiting.   No facility-administered encounter medications on file as of 10/07/2021.      Medical History: Past Medical History:  Diagnosis Date   GERD (gastroesophageal reflux disease)    ??mild LPR symptoms   History of abnormal Pap smear 2013   Obesity      Vital Signs: BP (!) 140/58 (BP Location: Left Arm, Patient Position: Sitting)   Pulse 88    Review of Systems  Constitutional:  Negative for fatigue and fever.  HENT:  Negative for sinus pressure and sore throat.   Respiratory:  Negative for cough and shortness of breath.   Cardiovascular:  Negative for chest pain, palpitations and leg swelling.  Neurological:  Positive for headaches.    Physical Exam Constitutional:      Appearance: Normal appearance.  HENT:     Head: Normocephalic.     Nose: Nose normal.     Mouth/Throat:     Mouth: Mucous membranes are moist.     Pharynx: Oropharynx is clear.   Cardiovascular:     Rate and Rhythm: Normal rate.     Pulses: Normal pulses.  Neurological:     General: No focal deficit present.     Mental Status: She is alert and oriented to person, place, and time. Mental status is at baseline.    Assessment/Plan: 1. Elevated blood pressure reading in office without diagnosis of hypertension Repeat BP 140/68.  Patient will keep log of BP for the next 24 hours, and follow up via telephone.   Hypertension Counseling:  The following hypertensive lifestyle modification were recommended and discussed:  1. Limiting alcohol intake to less than 1 oz/day of ethanol:(24 oz of beer or 8 oz of wine or 2 oz of 100-proof whiskey). 2. Importance of regular aerobic exercise and losing weight. 3. Reduce dietary saturated fat and cholesterol intake for overall cardiovascular health. 5. Maintaining adequate dietary potassium, calcium, and magnesium intake. 4. Regular monitoring of the blood pressure. 5. Reduce sodium intake to less than 100 mmol/day (less than 2.3 gm of sodium or less than 6 gm of sodium choride)       General Counseling: Linda Love verbalizes understanding of the findings of todays visit and agrees with plan of treatment. I have discussed any further diagnostic evaluation that may be needed or ordered today. We also reviewed her medications today. she has  been encouraged to call the office with any questions or concerns that should arise related to todays visit.   No orders of the defined types were placed in this encounter.   No orders of the defined types were placed in this encounter.   Time spent:30 Minutes    Johnna Acosta AGNP-C Nurse Practitioner

## 2021-10-08 ENCOUNTER — Telehealth: Payer: Self-pay

## 2021-10-08 NOTE — Telephone Encounter (Signed)
Error

## 2021-10-09 ENCOUNTER — Telehealth: Payer: Self-pay | Admitting: Adult Health

## 2021-10-09 ENCOUNTER — Encounter: Payer: Self-pay | Admitting: Adult Health

## 2021-10-09 DIAGNOSIS — Z0001 Encounter for general adult medical examination with abnormal findings: Secondary | ICD-10-CM

## 2021-10-09 DIAGNOSIS — I10 Essential (primary) hypertension: Secondary | ICD-10-CM

## 2021-10-09 MED ORDER — HYDROCHLOROTHIAZIDE 12.5 MG PO TABS: ORAL_TABLET | ORAL | 1 refills | Status: DC

## 2021-10-09 NOTE — Telephone Encounter (Signed)
Patient called for follow up on blood pressure.  BP readings were as follows   Wed 10/07/2021 150/80 148/81  Thursday 10/08/2021 159/85 140/86 152/88 145/97  Friday 10/09/2021 131/80 139/82  Patient has continued to have mild headache.  We discussed that since blood pressure appear improved today, we will send a low dose of HCTZ to pharmacy.  She may start  medication if blood pressures do not continue to improve.  She also has an appt for basic labs on 10/13/2021 here in clinic.    1. HTN, goal below 140/90 Hypertension Counseling:   The following hypertensive lifestyle modification were recommended and discussed:  1. Limiting alcohol intake to less than 1 oz/day of ethanol:(24 oz of beer or 8 oz of wine or 2 oz of 100-proof whiskey). 2. Importance of regular aerobic exercise and losing weight. 3. Reduce dietary saturated fat and cholesterol intake for overall cardiovascular health.  4. Regular monitoring of the blood pressure. 5. Reduce sodium intake to less than 100 mmol/day (less than 2.3 gm of sodium or less than 6 gm of sodium choride)   - hydrochlorothiazide (HYDRODIURIL) 12.5 MG tablet; Take 1/2 Tablet daily for High blood pressure, may increase to whole tablet daily if symptoms do not resolve.  Dispense: 30 tablet; Refill: 1  2. Encounter for general adult medical examination with abnormal findings - TSH; Future - Comprehensive metabolic panel; Future - CBC with Differential/Platelet; Future - Lipid Panel With LDL/HDL Ratio; Future - HgB A1c; Future     Johnna Acosta DNP, NP-C Nurse Practitioner

## 2021-10-11 DIAGNOSIS — N9489 Other specified conditions associated with female genital organs and menstrual cycle: Secondary | ICD-10-CM | POA: Diagnosis not present

## 2021-10-11 DIAGNOSIS — I10 Essential (primary) hypertension: Secondary | ICD-10-CM | POA: Diagnosis not present

## 2021-10-13 ENCOUNTER — Other Ambulatory Visit: Payer: Self-pay

## 2021-10-13 DIAGNOSIS — Z0001 Encounter for general adult medical examination with abnormal findings: Secondary | ICD-10-CM

## 2021-10-14 ENCOUNTER — Encounter: Payer: Self-pay | Admitting: Adult Health

## 2021-10-14 LAB — CBC WITH DIFFERENTIAL/PLATELET
Basophils Absolute: 0.1 10*3/uL (ref 0.0–0.2)
Basos: 1 %
EOS (ABSOLUTE): 0.5 10*3/uL — ABNORMAL HIGH (ref 0.0–0.4)
Eos: 7 %
Hematocrit: 43.6 % (ref 34.0–46.6)
Hemoglobin: 14.9 g/dL (ref 11.1–15.9)
Immature Grans (Abs): 0 10*3/uL (ref 0.0–0.1)
Immature Granulocytes: 0 %
Lymphocytes Absolute: 1.9 10*3/uL (ref 0.7–3.1)
Lymphs: 23 %
MCH: 31.4 pg (ref 26.6–33.0)
MCHC: 34.2 g/dL (ref 31.5–35.7)
MCV: 92 fL (ref 79–97)
Monocytes Absolute: 0.6 10*3/uL (ref 0.1–0.9)
Monocytes: 8 %
Neutrophils Absolute: 5 10*3/uL (ref 1.4–7.0)
Neutrophils: 61 %
Platelets: 305 10*3/uL (ref 150–450)
RBC: 4.74 x10E6/uL (ref 3.77–5.28)
RDW: 11.9 % (ref 11.7–15.4)
WBC: 8.1 10*3/uL (ref 3.4–10.8)

## 2021-10-14 LAB — COMPREHENSIVE METABOLIC PANEL
ALT: 17 IU/L (ref 0–32)
AST: 16 IU/L (ref 0–40)
Albumin/Globulin Ratio: 1.6 (ref 1.2–2.2)
Albumin: 4.5 g/dL (ref 3.9–4.9)
Alkaline Phosphatase: 62 IU/L (ref 44–121)
BUN/Creatinine Ratio: 22 (ref 9–23)
BUN: 14 mg/dL (ref 6–24)
Bilirubin Total: 0.4 mg/dL (ref 0.0–1.2)
CO2: 22 mmol/L (ref 20–29)
Calcium: 9 mg/dL (ref 8.7–10.2)
Chloride: 101 mmol/L (ref 96–106)
Creatinine, Ser: 0.65 mg/dL (ref 0.57–1.00)
Globulin, Total: 2.8 g/dL (ref 1.5–4.5)
Glucose: 122 mg/dL — ABNORMAL HIGH (ref 70–99)
Potassium: 3.8 mmol/L (ref 3.5–5.2)
Sodium: 139 mmol/L (ref 134–144)
Total Protein: 7.3 g/dL (ref 6.0–8.5)
eGFR: 111 mL/min/{1.73_m2} (ref >59)

## 2021-10-14 LAB — LIPID PANEL WITH LDL/HDL RATIO
Cholesterol, Total: 161 mg/dL (ref 100–199)
HDL: 48 mg/dL (ref >39)
LDL Chol Calc (NIH): 99 mg/dL (ref 0–99)
LDL/HDL Ratio: 2.1 ratio (ref 0.0–3.2)
Triglycerides: 74 mg/dL (ref 0–149)
VLDL Cholesterol Cal: 14 mg/dL (ref 5–40)

## 2021-10-14 LAB — HEMOGLOBIN A1C
Est. average glucose Bld gHb Est-mCnc: 117 mg/dL
Hgb A1c MFr Bld: 5.7 % — ABNORMAL HIGH (ref 4.8–5.6)

## 2021-10-14 LAB — TSH: TSH: 3.11 u[IU]/mL (ref 0.450–4.500)

## 2021-10-15 MED ORDER — NITROFURANTOIN MONOHYD MACRO 100 MG PO CAPS: 100.0000 mg | ORAL_CAPSULE | Freq: Two times a day (BID) | ORAL | 0 refills | Status: DC

## 2021-10-20 ENCOUNTER — Encounter: Payer: Self-pay | Admitting: Medical

## 2021-10-20 ENCOUNTER — Ambulatory Visit: Payer: Self-pay | Admitting: Medical

## 2021-10-20 VITALS — BP 122/82 | HR 89 | Temp 98.5°F

## 2021-10-20 DIAGNOSIS — J039 Acute tonsillitis, unspecified: Secondary | ICD-10-CM

## 2021-10-20 LAB — POCT RAPID STREP A (OFFICE): Rapid Strep A Screen: NEGATIVE

## 2021-10-20 MED ORDER — AMOXICILLIN-POT CLAVULANATE 875-125 MG PO TABS: 1.0000 | ORAL_TABLET | Freq: Two times a day (BID) | ORAL | 0 refills | Status: DC

## 2021-10-20 NOTE — Progress Notes (Signed)
   Subjective:    Patient ID: Linda Love, female    DOB: December 25, 1975, 46 y.o.   MRN: [id number]  HPI 46 yo in non acute distress. ST x  one week.   Review of Systems  Constitutional:  Positive for chills. Negative for fever.  HENT:  Positive for sore throat. Negative for congestion.   Respiratory:  Negative for shortness of breath.   Cardiovascular:  Negative for chest pain.  All other systems reviewed and are negative.      Objective:   Physical Exam Vitals and nursing note reviewed.  Constitutional:      Appearance: She is well-developed.  HENT:     Head: Normocephalic and atraumatic.     Right Ear: Tympanic membrane normal.     Left Ear: Tympanic membrane normal.     Mouth/Throat:     Mouth: Mucous membranes are moist.     Pharynx: Oropharynx is clear.     Tonsils: Tonsillar exudate present. No tonsillar abscesses. 2+ on the right. 2+ on the left.  Eyes:     Conjunctiva/sclera: Conjunctivae normal.     Pupils: Pupils are equal, round, and reactive to light.  Cardiovascular:     Rate and Rhythm: Normal rate and regular rhythm.     Heart sounds: Normal heart sounds.  Pulmonary:     Effort: Pulmonary effort is normal.     Breath sounds: Normal breath sounds.  Musculoskeletal:     Cervical back: Normal range of motion and neck supple.  Neurological:     Mental Status: She is alert.           Assessment & Plan:   Encounter Diagnosis  Name Primary?   Tonsillitis Yes   Meds ordered this encounter  Medications   amoxicillin-clavulanate (AUGMENTIN) 875-125 MG tablet    Sig: Take 1 tablet by mouth 2 (two) times daily. Take with food    Dispense:  20 tablet    Refill:  0   Returnh in 3-5 days if not improving. Patient verbalizes understanding and has no questions at discharge.

## 2021-10-21 ENCOUNTER — Telehealth: Payer: Self-pay

## 2021-11-04 ENCOUNTER — Encounter: Payer: Self-pay | Admitting: Nurse Practitioner

## 2021-11-04 ENCOUNTER — Ambulatory Visit (INDEPENDENT_AMBULATORY_CARE_PROVIDER_SITE_OTHER): Payer: BC Managed Care – PPO | Admitting: Nurse Practitioner

## 2021-11-04 VITALS — BP 136/100 | HR 80 | Temp 97.4°F | Resp 12 | Ht 69.0 in | Wt 238.0 lb

## 2021-11-04 DIAGNOSIS — Z6835 Body mass index (BMI) 35.0-35.9, adult: Secondary | ICD-10-CM

## 2021-11-04 DIAGNOSIS — I10 Essential (primary) hypertension: Secondary | ICD-10-CM | POA: Insufficient documentation

## 2021-11-04 DIAGNOSIS — Z1211 Encounter for screening for malignant neoplasm of colon: Secondary | ICD-10-CM | POA: Diagnosis not present

## 2021-11-04 MED ORDER — HYDROCHLOROTHIAZIDE 12.5 MG PO TABS: 12.5000 mg | ORAL_TABLET | Freq: Every day | ORAL | 0 refills | Status: DC

## 2021-11-04 NOTE — Assessment & Plan Note (Signed)
Patient was placed on HCTZ at wellness clinic at work.  With elevated blood pressure she has waxed and waned with blood pressure she has been checking at home with readings of 1 35-1 45 systolic and mid 01V diastolic.  Continue checking blood pressure 3 times a week and record and send to me via MyChart.  Will continue HCTZ 12.5 mg daily work on lifestyle modifications check renal function today to make sure kidneys have done well with thiazide diuretic.

## 2021-11-04 NOTE — Progress Notes (Signed)
New Patient Office Visit  Subjective    Patient ID: Linda Love, female    DOB: Dec 16, 1975  Age: 46 y.o. MRN: [id number]  CC:  Chief Complaint  Patient presents with   Establish Care    Previous PCP Dr Alphonsus Sias, [facility], and recently seen at [facility] clinic   Hypertension    Would like to discuss    HPI Linda Love presents to establish care  HTN: States that she was seen at the wellness clinic at work and had an elevated BP and was placed on 6.25mg  HCTX and she is now taking 12.5mg  HCTZ daily.    Glasses: needs new glasses. Has not been seen in 3 years  Colonoscopy: [place]  Pap smear: end of September  TDAP: update Covid vaccines: Refused    Outpatient Encounter Medications as of 11/04/2021  Medication Sig   acetaminophen (TYLENOL) 325 MG tablet Take 650 mg by mouth every 6 (six) hours as needed.   Multiple Vitamin (MULTIVITAMIN) tablet Take 1 tablet by mouth daily.     [DISCONTINUED] hydrochlorothiazide (HYDRODIURIL) 12.5 MG tablet Take 1/2 Tablet daily for High blood pressure, may increase to whole tablet daily if symptoms do not resolve. (Patient taking differently: Take 12.5 mg by mouth daily.)   hydrochlorothiazide (HYDRODIURIL) 12.5 MG tablet Take 1 tablet (12.5 mg total) by mouth daily.   [DISCONTINUED] amoxicillin (AMOXIL) 875 MG tablet Take 1 tablet (875 mg total) by mouth 2 (two) times daily. (Patient not taking: Reported on 10/20/2021)   [DISCONTINUED] amoxicillin-clavulanate (AUGMENTIN) 875-125 MG tablet Take 1 tablet by mouth 2 (two) times daily. Take with food   [DISCONTINUED] ibuprofen (ADVIL,MOTRIN) 200 MG tablet Take 400 mg by mouth. (Patient not taking: Reported on 10/20/2021)   [DISCONTINUED] nitrofurantoin, macrocrystal-monohydrate, (MACROBID) 100 MG capsule Take 1 capsule (100 mg total) by mouth 2 (two) times daily.   [DISCONTINUED] ondansetron (ZOFRAN-ODT) 4 MG disintegrating tablet Take 1 tablet (4 mg total) by mouth every  8 (eight) hours as needed for nausea or vomiting. (Patient not taking: Reported on 10/20/2021)   No facility-administered encounter medications on file as of 11/04/2021.    Past Medical History:  Diagnosis Date   GERD (gastroesophageal reflux disease)    ??mild LPR symptoms   History of abnormal Pap smear 2013   Obesity     Past Surgical History:  Procedure Laterality Date   LEEP  2013    Family History  Problem Relation Age of Onset   Hypertension Mother    Cancer Mother        skin cancer   Alcohol abuse Father    Heart disease Father        heart failure   Atrial fibrillation Father    Heart disease Brother    Alcohol abuse Brother    Cancer Maternal Aunt        lymphoma   Atrial fibrillation Paternal Aunt    Cancer Paternal Uncle        colon cancer   Diabetes Paternal Grandmother    Alzheimer's disease Paternal Grandfather    Cancer Cousin 72       Breast    Social History   Socioeconomic History   Marital status: Single    Spouse name: Not on file   Number of children: 0   Years of education: Not on file   Highest education level: Associate degree: academic program  Occupational History   Occupation: Print production planner    Comment: [employer]  Tobacco Use   Smoking status: Former    Years: 5.00    Types: Cigarettes    Quit date: 10/28/2010    Years since quitting: 11.0   Smokeless tobacco: Never   Tobacco comments:    5 years off and on, socially smoking   Vaping Use   Vaping Use: Never used  Substance and Sexual Activity   Alcohol use: Yes    Comment: rarely   Drug use: No   Sexual activity: Yes    Partners: Male    Birth control/protection: Condom  Other Topics Concern   Not on file  Social History Narrative   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 Negative for chills and fever.  Respiratory:  Negative for shortness of breath.   Cardiovascular:  Negative for chest pain and leg swelling.  Gastrointestinal:  Negative for abdominal pain, constipation, diarrhea, nausea and vomiting.       BM daily  Neurological:  Positive for headaches. Negative for dizziness.  Psychiatric/Behavioral:  Negative for hallucinations and suicidal ideas.         Objective    BP (!) 136/100   Pulse 80   Temp (!) 97.4 F (36.3 C)   Resp 12   Ht 5\' 9"  (1.753 m)   Wt 238 lb (108 kg)   LMP 10/30/2021   SpO2 97%   BMI 35.15 kg/m   Physical Exam Vitals and nursing note reviewed.  Constitutional:      Appearance: Normal appearance. She is obese.  HENT:     Right Ear: Tympanic membrane, ear canal and external ear normal.     Left Ear: Tympanic membrane, ear canal and external ear normal.     Mouth/Throat:     Mouth: Mucous membranes are moist.     Pharynx: Oropharynx is clear.  Eyes:     Extraocular Movements: Extraocular movements intact.     Pupils: Pupils are equal, round, and reactive to light.     Comments: Wears glasses   Cardiovascular:     Rate and Rhythm: Normal rate and regular rhythm.     Pulses: Normal pulses.     Heart sounds: Normal heart sounds.  Pulmonary:     Effort: Pulmonary effort is normal.     Breath sounds: Normal breath sounds.  Abdominal:     General: Bowel sounds are normal. There is no distension.     Palpations: There is no mass.     Tenderness: There is no abdominal tenderness.     Hernia: No hernia is present.  Musculoskeletal:     Right lower leg: No edema.     Left lower leg: No edema.  Lymphadenopathy:     Cervical: No cervical adenopathy.  Skin:    General: Skin is warm.  Neurological:     General: No focal  deficit present.     Mental Status: She is alert.     Deep Tendon Reflexes:     Reflex Scores:      Bicep reflexes are 2+ on the right side and 2+ on the left side.      Patellar  reflexes are 2+ on the right side and 2+ on the left side.    Comments: Bilateral upper and lower extremity strength 5/5  Psychiatric:        Mood and Affect: Mood normal.        Behavior: Behavior normal.        Thought Content: Thought content normal.        Judgment: Judgment normal.         Assessment & Plan:   Problem List Items Addressed This Visit       Cardiovascular and Mediastinum   HTN, goal below 140/90    Patient was placed on HCTZ at wellness clinic at work.  With elevated blood pressure she has waxed and waned with blood pressure she has been checking at home with readings of 1 35-1 45 systolic and mid 85U diastolic.  Continue checking blood pressure 3 times a week and record and send to me via MyChart.  Will continue HCTZ 12.5 mg daily work on lifestyle modifications check renal function today to make sure kidneys have done well with thiazide diuretic.      Relevant Medications   hydrochlorothiazide (HYDRODIURIL) 12.5 MG tablet   Other Relevant Orders   Basic metabolic panel     Other   Obesity - Primary    Encourage patient to start exercising she can start with walking 1 or 2 times a week 15 minutes at a time until tolerance gets buildup.  This will help with her blood pressure along with decreasing some weight.  Also gave patient information in regards to Mediterranean diet.      Other Visit Diagnoses     Primary hypertension       Relevant Medications   hydrochlorothiazide (HYDRODIURIL) 12.5 MG tablet   Other Relevant Orders   Basic metabolic panel   Screening for colon cancer       Relevant Orders   Ambulatory referral to Gastroenterology       Return in about 3 months (around 02/04/2022) for CPe and labs.   Audria Nine, NP

## 2021-11-04 NOTE — Assessment & Plan Note (Signed)
Encourage patient to start exercising she can start with walking 1 or 2 times a week 15 minutes at a time until tolerance gets buildup.  This will help with her blood pressure along with decreasing some weight.  Also gave patient information in regards to Mediterranean diet.

## 2021-11-04 NOTE — Patient Instructions (Signed)
Nice to see you today We will keep you on the hydrochlorothiazide 12.5mg . As you can work on starting to exercise, start with walking I will see you in 3 months for your physical, sooner if you need me  Dr. Karleen Hampshire Copland is the sports medicine physician in office if you need him

## 2021-11-05 LAB — BASIC METABOLIC PANEL
BUN: 14 mg/dL (ref 6–23)
CO2: 26 mEq/L (ref 19–32)
Calcium: 9.1 mg/dL (ref 8.4–10.5)
Chloride: 97 mEq/L (ref 96–112)
Creatinine, Ser: 0.6 mg/dL (ref 0.40–1.20)
GFR: 108.38 mL/min (ref >60.00)
Glucose, Bld: 112 mg/dL — ABNORMAL HIGH (ref 70–99)
Potassium: 3.7 mEq/L (ref 3.5–5.1)
Sodium: 136 mEq/L (ref 135–145)

## 2021-12-24 ENCOUNTER — Ambulatory Visit: Payer: BC Managed Care – PPO | Admitting: Obstetrics & Gynecology

## 2021-12-24 ENCOUNTER — Encounter: Payer: Self-pay | Admitting: Obstetrics & Gynecology

## 2021-12-24 ENCOUNTER — Other Ambulatory Visit (HOSPITAL_COMMUNITY)
Admission: RE | Admit: 2021-12-24 | Discharge: 2021-12-24 | Disposition: A | Discharge status: Home / Self Care | Payer: BC Managed Care – PPO | Source: Ambulatory Visit | Attending: Obstetrics & Gynecology | Admitting: Obstetrics & Gynecology

## 2021-12-24 VITALS — BP 145/82 | HR 89 | Ht 69.0 in | Wt 248.8 lb

## 2021-12-24 DIAGNOSIS — Z1211 Encounter for screening for malignant neoplasm of colon: Secondary | ICD-10-CM | POA: Diagnosis not present

## 2021-12-24 DIAGNOSIS — Z1231 Encounter for screening mammogram for malignant neoplasm of breast: Secondary | ICD-10-CM | POA: Diagnosis not present

## 2021-12-24 DIAGNOSIS — Z01419 Encounter for gynecological examination (general) (routine) without abnormal findings: Secondary | ICD-10-CM | POA: Diagnosis not present

## 2021-12-24 DIAGNOSIS — Z113 Encounter for screening for infections with a predominantly sexual mode of transmission: Secondary | ICD-10-CM | POA: Diagnosis not present

## 2021-12-24 NOTE — Progress Notes (Signed)
Pt presents for AEX. Pt has never had mammogram or colonoscopy. Pt being managed by PCP for HTN.

## 2021-12-24 NOTE — Progress Notes (Signed)
GYNECOLOGY ANNUAL PREVENTATIVE CARE ENCOUNTER NOTE  History:     Linda Love is a 46 y.o. G0P0000 female here for a routine annual gynecologic exam.  History of CIN III s/p LEEP in 2013, had normal pap in 2014 and we have not seen her since then. Reports normal outside pap at one point, but there have been many years without a pap smear. Current complaints: none.   Denies abnormal vaginal bleeding, discharge, pelvic pain, problems with intercourse or other gynecologic concerns.    Gynecologic History Patient's last menstrual period was 12/22/2021. Contraception: condoms Last Pap: Unsure. Result was normal with negative HPV Last Mammogram: Never had one Last Colonoscopy: Never had one  Obstetric History OB History  Gravida Para Term Preterm AB Living  0 0 0 0 0 0  SAB IAB Ectopic Multiple Live Births  0 0 0 0      Past Medical History:  Diagnosis Date   GERD (gastroesophageal reflux disease)    ??mild LPR symptoms   History of abnormal Pap smear 2013   Obesity     Past Surgical History:  Procedure Laterality Date   LEEP  2013    Current Outpatient Medications on File Prior to Visit  Medication Sig Dispense Refill   acetaminophen (TYLENOL) 325 MG tablet Take 650 mg by mouth every 6 (six) hours as needed.     hydrochlorothiazide (HYDRODIURIL) 12.5 MG tablet Take 1 tablet (12.5 mg total) by mouth daily. 90 tablet 0   Multiple Vitamin (MULTIVITAMIN) tablet Take 1 tablet by mouth daily.       No current facility-administered medications on file prior to visit.    No Known Allergies  Social History:  reports that she quit smoking about 11 years ago. Her smoking use included cigarettes. She has never used smokeless tobacco. She reports current alcohol use. She reports that she does not use drugs.  Family History  Problem Relation Age of Onset   Hypertension Mother    Cancer Mother        skin cancer   Alcohol abuse Father    Heart disease Father        heart  failure   Atrial fibrillation Father    Heart disease Brother    Alcohol abuse Brother    Cancer Maternal Aunt        lymphoma   Atrial fibrillation Paternal Aunt    Cancer Paternal Uncle        colon cancer   Diabetes Paternal Grandmother    Alzheimer's disease Paternal Grandfather    Cancer Cousin 48       Breast    The following portions of the patient's history were reviewed and updated as appropriate: allergies, current medications, past family history, past medical history, past social history, past surgical history and problem list.  Review of Systems Pertinent items noted in HPI and remainder of comprehensive ROS otherwise negative.  Physical Exam:  BP (!) 145/82   Pulse 89   Ht 5\' 9"  (1.753 m)   Wt 248 lb 12.8 oz (112.9 kg)   LMP 12/22/2021   BMI 36.74 kg/m  CONSTITUTIONAL: Well-developed, well-nourished female in no acute distress.  HENT:  Normocephalic, atraumatic, External right and left ear normal.  EYES: Conjunctivae and EOM are normal. Pupils are equal, round, and reactive to light. No scleral icterus.  NECK: Normal range of motion, supple, no masses.  Normal thyroid.  SKIN: Skin is warm and dry. No rash noted. Not diaphoretic. No  erythema. No pallor. MUSCULOSKELETAL: Normal range of motion. No tenderness.  No cyanosis, clubbing, or edema. NEUROLOGIC: Alert and oriented to person, place, and time. Normal reflexes, muscle tone coordination.  PSYCHIATRIC: Normal mood and affect. Normal behavior. Normal judgment and thought content. CARDIOVASCULAR: Normal heart rate noted, regular rhythm RESPIRATORY: Clear to auscultation bilaterally. Effort and breath sounds normal, no problems with respiration noted. BREASTS: Symmetric in size. No masses, tenderness, skin changes, nipple drainage, or lymphadenopathy bilaterally. Performed in the presence of a chaperone. ABDOMEN: Soft, no distention noted.  No tenderness, rebound or guarding.  PELVIC: Normal appearing external  genitalia and urethral meatus; normal appearing vaginal mucosa and cervix.  No abnormal vaginal discharge noted.  Pap smear obtained.  Normal uterine size, no other palpable masses, no uterine or adnexal tenderness.  Performed in the presence of a chaperone.   Assessment and Plan:     1. Breast cancer screening by mammogram Mammogram scheduled - MM 3D SCREEN BREAST BILATERAL; Future  2. Colon cancer screening Discussed need for colon cancer screening over the age 92. Colonoscopy recommended as this is the gold standard. Referral made to Gastroenterology for colonoscopy - Ambulatory referral to Gastroenterology  3. Routine screening for STI (sexually transmitted infection) STI screen done as per her request, will follow up results and manage accordingly. - Cytology - PAP - RPR+HBsAg+HCVAb+HIV  4. Well woman exam with routine gynecological exam - Cytology - PAP Will follow up results of pap smear and manage accordingly. She will follow up with PCP for management of her HTN. Routine preventative health maintenance measures emphasized. Please refer to After Visit Summary for other counseling recommendations.      Verita Schneiders, MD, [place] [facility], [place] [facility]

## 2021-12-25 ENCOUNTER — Telehealth: Payer: Self-pay

## 2021-12-25 ENCOUNTER — Other Ambulatory Visit: Payer: Self-pay

## 2021-12-25 DIAGNOSIS — Z1211 Encounter for screening for malignant neoplasm of colon: Secondary | ICD-10-CM

## 2021-12-25 LAB — RPR+HBSAG+HCVAB+...
HIV Screen 4th Generation wRfx: NONREACTIVE
Hep C Virus Ab: NONREACTIVE
Hepatitis B Surface Ag: NEGATIVE
RPR Ser Ql: NONREACTIVE

## 2021-12-25 MED ORDER — NA SULFATE-K SULFATE-MG SULF 17.5-3.13-1.6 GM/177ML PO SOLN: 1.0000 | Freq: Once | ORAL | 0 refills | Status: AC

## 2021-12-25 NOTE — Telephone Encounter (Signed)
Gastroenterology Pre-Procedure Review  Request Date: 01/29/22 Requesting Physician: Dr. Vicente Males  PATIENT REVIEW QUESTIONS: The patient responded to the following health history questions as indicated:    1. Are you having any GI issues? no 2. Do you have a personal history of Polyps? no 3. Do you have a family history of Colon Cancer or Polyps? yes (uncle colon cancer) 4. Diabetes Mellitus? no 5. Joint replacements in the past 12 months?no 6. Major health problems in the past 3 months?no 7. Any artificial heart valves, MVP, or defibrillator?no    MEDICATIONS & ALLERGIES:    Patient reports the following regarding taking any anticoagulation/antiplatelet therapy:   Plavix, Coumadin, Eliquis, Xarelto, Lovenox, Pradaxa, Brilinta, or Effient? no Aspirin? no  Patient confirms/reports the following medications:  Current Outpatient Medications  Medication Sig Dispense Refill   acetaminophen (TYLENOL) 325 MG tablet Take 650 mg by mouth every 6 (six) hours as needed.     hydrochlorothiazide (HYDRODIURIL) 12.5 MG tablet Take 1 tablet (12.5 mg total) by mouth daily. 90 tablet 0   Multiple Vitamin (MULTIVITAMIN) tablet Take 1 tablet by mouth daily.       No current facility-administered medications for this visit.    Patient confirms/reports the following allergies:  No Known Allergies  No orders of the defined types were placed in this encounter.   AUTHORIZATION INFORMATION Primary Insurance: 1D#: Group #:  Secondary Insurance: 1D#: Group #:  SCHEDULE INFORMATION: Date:  Time: Location:

## 2021-12-30 LAB — CYTOLOGY - PAP
Chlamydia: NEGATIVE
Comment: NEGATIVE
Comment: NEGATIVE
Comment: NEGATIVE
Comment: NORMAL
Diagnosis: UNDETERMINED — AB
High risk HPV: NEGATIVE
Neisseria Gonorrhea: NEGATIVE
Trichomonas: NEGATIVE

## 2022-01-21 ENCOUNTER — Ambulatory Visit
Admission: RE | Admit: 2022-01-21 | Discharge: 2022-01-21 | Disposition: A | Discharge status: Home / Self Care | Payer: BC Managed Care – PPO | Source: Ambulatory Visit | Attending: Obstetrics & Gynecology | Admitting: Obstetrics & Gynecology

## 2022-01-21 DIAGNOSIS — Z1231 Encounter for screening mammogram for malignant neoplasm of breast: Secondary | ICD-10-CM | POA: Insufficient documentation

## 2022-01-27 ENCOUNTER — Telehealth: Payer: Self-pay | Admitting: Gastroenterology

## 2022-01-27 NOTE — Telephone Encounter (Signed)
Patient would like to cancel colonoscopy and states she will call back after she has her insurance in order.

## 2022-01-27 NOTE — Telephone Encounter (Signed)
Colonoscopy canceled with Trish in Endo.  Thanks,  Hadley Detloff, CMA 

## 2022-01-29 ENCOUNTER — Encounter: Admission: RE | Payer: Self-pay | Source: Ambulatory Visit

## 2022-01-29 ENCOUNTER — Ambulatory Visit
Admission: RE | Admit: 2022-01-29 | Payer: BC Managed Care – PPO | Source: Ambulatory Visit | Admitting: Gastroenterology

## 2022-01-29 SURGERY — COLONOSCOPY WITH PROPOFOL
Anesthesia: General

## 2022-02-04 ENCOUNTER — Emergency Department
Admission: EM | Admit: 2022-02-04 | Discharge: 2022-02-05 | Disposition: A | Discharge status: Home / Self Care | Payer: BC Managed Care – PPO | Attending: Emergency Medicine | Admitting: Emergency Medicine

## 2022-02-04 ENCOUNTER — Other Ambulatory Visit: Payer: Self-pay

## 2022-02-04 ENCOUNTER — Emergency Department: Payer: BC Managed Care – PPO

## 2022-02-04 DIAGNOSIS — E876 Hypokalemia: Secondary | ICD-10-CM | POA: Diagnosis not present

## 2022-02-04 DIAGNOSIS — R202 Paresthesia of skin: Secondary | ICD-10-CM | POA: Diagnosis not present

## 2022-02-04 DIAGNOSIS — R6884 Jaw pain: Secondary | ICD-10-CM | POA: Insufficient documentation

## 2022-02-04 DIAGNOSIS — R0789 Other chest pain: Secondary | ICD-10-CM | POA: Diagnosis not present

## 2022-02-04 DIAGNOSIS — R079 Chest pain, unspecified: Secondary | ICD-10-CM | POA: Diagnosis not present

## 2022-02-04 DIAGNOSIS — I10 Essential (primary) hypertension: Secondary | ICD-10-CM | POA: Diagnosis not present

## 2022-02-04 LAB — COMPREHENSIVE METABOLIC PANEL
ALT: 17 U/L (ref 0–44)
AST: 19 U/L (ref 15–41)
Albumin: 4.2 g/dL (ref 3.5–5.0)
Alkaline Phosphatase: 60 U/L (ref 38–126)
Anion gap: 8 (ref 5–15)
BUN: 12 mg/dL (ref 6–20)
CO2: 26 mmol/L (ref 22–32)
Calcium: 9.1 mg/dL (ref 8.9–10.3)
Chloride: 103 mmol/L (ref 98–111)
Creatinine, Ser: 0.9 mg/dL (ref 0.44–1.00)
GFR, Estimated: >60 mL/min (ref >60)
Glucose, Bld: 153 mg/dL — ABNORMAL HIGH (ref 70–99)
Potassium: 2.9 mmol/L — ABNORMAL LOW (ref 3.5–5.1)
Sodium: 137 mmol/L (ref 135–145)
Total Bilirubin: 0.6 mg/dL (ref 0.3–1.2)
Total Protein: 8 g/dL (ref 6.5–8.1)

## 2022-02-04 LAB — CBC WITH DIFFERENTIAL/PLATELET
Abs Immature Granulocytes: 0.06 10*3/uL (ref 0.00–0.07)
Basophils Absolute: 0.1 10*3/uL (ref 0.0–0.1)
Basophils Relative: 1 %
Eosinophils Absolute: 0.5 10*3/uL (ref 0.0–0.5)
Eosinophils Relative: 4 %
HCT: 42.4 % (ref 36.0–46.0)
Hemoglobin: 14.5 g/dL (ref 12.0–15.0)
Immature Granulocytes: 1 %
Lymphocytes Relative: 27 %
Lymphs Abs: 3.5 10*3/uL (ref 0.7–4.0)
MCH: 30.9 pg (ref 26.0–34.0)
MCHC: 34.2 g/dL (ref 30.0–36.0)
MCV: 90.2 fL (ref 80.0–100.0)
Monocytes Absolute: 0.8 10*3/uL (ref 0.1–1.0)
Monocytes Relative: 6 %
Neutro Abs: 8.2 10*3/uL — ABNORMAL HIGH (ref 1.7–7.7)
Neutrophils Relative %: 61 %
Platelets: 357 10*3/uL (ref 150–400)
RBC: 4.7 MIL/uL (ref 3.87–5.11)
RDW: 12 % (ref 11.5–15.5)
WBC: 13.1 10*3/uL — ABNORMAL HIGH (ref 4.0–10.5)
nRBC: 0 % (ref 0.0–0.2)

## 2022-02-04 LAB — TROPONIN I (HIGH SENSITIVITY)
Troponin I (High Sensitivity): 6 ng/L (ref <18)
Troponin I (High Sensitivity): 6 ng/L (ref <18)

## 2022-02-04 LAB — T4, FREE: Free T4: 1 ng/dL (ref 0.61–1.12)

## 2022-02-04 LAB — TSH: TSH: 4.707 u[IU]/mL — ABNORMAL HIGH (ref 0.350–4.500)

## 2022-02-04 MED ORDER — POTASSIUM CHLORIDE CRYS ER 20 MEQ PO TBCR
40.0000 meq | EXTENDED_RELEASE_TABLET | Freq: Once | ORAL | Status: AC
  Administered 2022-02-04: 40 meq via ORAL
  Filled 2022-02-04: qty 2

## 2022-02-04 MED ORDER — POTASSIUM CHLORIDE 10 MEQ/100ML IV SOLN
10.0000 meq | Freq: Once | INTRAVENOUS | Status: AC
  Administered 2022-02-04: 10 meq via INTRAVENOUS
  Filled 2022-02-04: qty 100

## 2022-02-04 NOTE — ED Provider Triage Note (Signed)
Emergency Medicine Provider Triage Evaluation Note  Linda Love, a 46 y.o. female  was evaluated in triage.  Pt complains of weight and blood pressure as well as heart racing.  Presents to the ED from home, where she denies any frank chest pain or shortness of breath.  She does endorse tingling sensation in her cheeks as well as some associated nausea and chills.  She also endorses a mild frontal headache at this time but she reports symptoms began at about 3 PM this afternoon while at work.  She does admit to stressful work situation, but denies any history of anxiety.  She was recently started on HCTZ by her PCP for some elevated blood pressure readings.  Review of Systems  Positive: Heart racing, elevated BP Negative: CP, SOB  Physical Exam  BP (!) 190/104 (BP Location: Right Arm)   Pulse 86   Temp 98.6 F (37 C) (Oral)   Resp 20   Ht 5\' 9"  (1.753 m)   Wt 115 kg   LMP 01/14/2022   SpO2 100%   BMI 37.44 kg/m  Gen:   Awake, no distress  anxious Resp:  Normal effort CTA MSK:   Moves extremities without difficulty  Other:    Medical Decision Making  Medically screening exam initiated at 7:31 PM.  Appropriate orders placed.  01/16/2022 was informed that the remainder of the evaluation will be completed by another provider, this initial triage assessment does not replace that evaluation, and the importance of remaining in the ED until their evaluation is complete.  Patient to the ED for evaluation of some elevated blood pressure readings as well as some heart racing episodes today.  She reports a mild headache as well as some associated chills and tingling in her chest but she denies any frank chest pain or shortness of breath.   Janace Hoard, PA-C 02/04/22 1933

## 2022-02-04 NOTE — ED Triage Notes (Signed)
Ambulatory to triage with c/o elevated BP at home. Also states she felt heart racing and chills. Denies chest pain, Denies shortness of breath. Does report tingling in cheeks and nausea. Sx began apx 3pm today. Pt states she has been more stressed than normal at new job.No hx of anxiety previously PA in triage for eval at this time.

## 2022-02-04 NOTE — Discharge Instructions (Signed)
Call your doctor for an appointment this week to discuss blood pressure management, and also to recheck your potassium levels.  Thank you for choosing [place] for your health care today!  Please see your primary doctor this week for a follow up appointment.   If you do not have a primary doctor call the following clinics to establish care:  If you have insurance:  [facility] [phone] [address] [place] [place] [address]   [facility]  [phone] [address] [place] [address] [place] [address]   If you do not have insurance:  [facility]  [phone] [address] [place] [address]  Sometimes, in the early stages of certain disease courses it is difficult to detect in the emergency department evaluation -- so, it is important that you continue to monitor your symptoms and call your doctor right away or return to the emergency department if you develop any new or worsening symptoms.  It was my pleasure to care for you today.   Daneil Dan Modesto Charon, MD

## 2022-02-04 NOTE — ED Provider Notes (Signed)
[facility] Provider Note    Event Date/Time   First MD Initiated Contact with Patient 02/04/22 2303     (approximate)   History   Hypertension   HPI  Linda Love is a 46 y.o. female   Past medical history of hypertension, GERD, elevated BMI who presents to the emergency department with high blood pressure.  She states that this week has been particularly stressful at work and today she had a stressful day and developed tingling around her lips and some discomfort/tightness in her jaw.  She denied chest pain or dyspnea, had an episode of palpitations and chest discomfort that was transient while in the emergency department.  She checked her blood pressure at home after work and it was elevated.  She was diagnosed with high blood pressure this summer and started on a low-dose of hydrochlorothiazide.  She has been compliant.  No other complaints, no symptoms currently.   History was obtained via the patient and independent historians including family members at bedside. Review of external medical notes including October 07, 2021 doctors visit outpatient which addressed her hypertension     Physical Exam   Triage Vital Signs: ED Triage Vitals  Enc Vitals Group     BP 02/04/22 1922 (!) 190/104     Pulse Rate 02/04/22 1922 86     Resp 02/04/22 1922 20     Temp 02/04/22 1922 98.6 F (37 C)     Temp Source 02/04/22 1922 Oral     SpO2 02/04/22 1922 100 %     Weight 02/04/22 1923 253 lb 8.5 oz (115 kg)     Height 02/04/22 1923 5\' 9"  (1.753 m)     Head Circumference --      Peak Flow --      Pain Score 02/04/22 1923 0     Pain Loc --      Pain Edu? --      Excl. in GC? --     Most recent vital signs: Vitals:   02/04/22 2330 02/04/22 2356  BP: 133/63   Pulse: 93   Resp: 18   Temp:  98.5 F (36.9 C)  SpO2: 100%     General: Awake, no distress.  CV:  Good peripheral perfusion.  Normal rate and rhythm without murmurs. Resp:  Normal effort.   Clear to auscultation bilaterally Abd:  No distention.  Nontender to palpation Other:  No focal neurological deficits including motor or sensory exam, facial asymmetry, dysarthria, finger-nose.   ED Results / Procedures / Treatments   Labs (all labs ordered are listed, but only abnormal results are displayed) Labs Reviewed  CBC WITH DIFFERENTIAL/PLATELET - Abnormal; Notable for the following components:      Result Value   WBC 13.1 (*)    Neutro Abs 8.2 (*)    All other components within normal limits  COMPREHENSIVE METABOLIC PANEL - Abnormal; Notable for the following components:   Potassium 2.9 (*)    Glucose, Bld 153 (*)    All other components within normal limits  TSH - Abnormal; Notable for the following components:   TSH 4.707 (*)    All other components within normal limits  T4, FREE  TROPONIN I (HIGH SENSITIVITY)  TROPONIN I (HIGH SENSITIVITY)     I reviewed labs and they are notable for blood cell count is 13.1, potassium is 2.9.  Elevated TSH but normal T4.  Troponin flat x2.  EKG  ED ECG REPORT I, 13/09/23  Modesto Charon, the attending physician, personally viewed and interpreted this ECG.   Date: 02/04/2022  EKG Time: 1951  Rate: 91  Rhythm: frequent PVC's noted  Axis: nl  Intervals:none  ST&T Change: No ischemic changes noted    RADIOLOGY I independently reviewed and interpreted chest x-ray and see no focalities or pneumothorax   PROCEDURES:  Critical Care performed: No  Procedures   MEDICATIONS ORDERED IN ED: Medications  potassium chloride 10 mEq in 100 mL IVPB (10 mEq Intravenous New Bag/Given 02/04/22 2333)  potassium chloride SA (KLOR-CON M) CR tablet 40 mEq (40 mEq Oral Given 02/04/22 2320)     IMPRESSION / MDM / ASSESSMENT AND PLAN / ED COURSE  I reviewed the triage vital signs and the nursing notes.                              Differential diagnosis includes, but is not limited to, hypertension, ACS, PE, stroke, electrolyte derangements,  anxiety or stress related   The patient is on the cardiac monitor to evaluate for evidence of arrhythmia and/or significant heart rate changes.  MDM: This is a patient with hypertension and atypical chest/jaw pain concerning for ACS.  Fortunately her EKG appears nonischemic and her troponins were flat x2 and she is comfortable in the emergency department, so doubt ACS at this time.  Her blood pressure came down to 130s over 60s without intervention.  Her potassium was low at 2.9 without EKG changes, will replete in the emergency department with IV potassium as well as oral repletion.  He had a normal neurological exam without symptoms currently so I doubt the symptoms were due to a stroke.  Kidney function is normal on her lab testing today.  I considered admission or observation but given normal work-up as above, normalized blood pressure, and asymptomatic at this time, and hypokalemia with initial repletion in the emergency department she will be safe for discharge home with close PMD follow-up to recheck potassium and ongoing blood pressure management.   Patient's presentation is most consistent with acute presentation with potential threat to life or bodily function.       FINAL CLINICAL IMPRESSION(S) / ED DIAGNOSES   Final diagnoses:  Uncontrolled hypertension  Jaw pain  Hypokalemia     Rx / DC Orders   ED Discharge Orders     None        Note:  This document was prepared using Dragon voice recognition software and may include unintentional dictation errors.    Pilar Jarvis, MD 02/05/22 [phone]

## 2022-02-08 ENCOUNTER — Encounter: Payer: Self-pay | Admitting: Physician Assistant

## 2022-02-08 ENCOUNTER — Ambulatory Visit (INDEPENDENT_AMBULATORY_CARE_PROVIDER_SITE_OTHER): Payer: Self-pay | Admitting: Physician Assistant

## 2022-02-08 VITALS — BP 148/94 | HR 87 | Temp 98.4°F | Ht 69.0 in | Wt 246.0 lb

## 2022-02-08 DIAGNOSIS — I10 Essential (primary) hypertension: Secondary | ICD-10-CM

## 2022-02-08 DIAGNOSIS — F419 Anxiety disorder, unspecified: Secondary | ICD-10-CM

## 2022-02-08 DIAGNOSIS — Z566 Other physical and mental strain related to work: Secondary | ICD-10-CM

## 2022-02-08 MED ORDER — LISINOPRIL 10 MG PO TABS: 10.0000 mg | ORAL_TABLET | Freq: Every day | ORAL | 0 refills | Status: DC

## 2022-02-08 MED ORDER — ESCITALOPRAM OXALATE 10 MG PO TABS: 10.0000 mg | ORAL_TABLET | Freq: Every day | ORAL | 0 refills | Status: DC

## 2022-02-08 MED ORDER — HYDROXYZINE HCL 10 MG PO TABS: 10.0000 mg | ORAL_TABLET | Freq: Three times a day (TID) | ORAL | 0 refills | Status: DC | PRN

## 2022-02-08 NOTE — Progress Notes (Unsigned)
[facility] [address] [place], [place] [address]   Office Visit Note  Patient Name: Linda Love Date of Birth [id number]  Medical Record number [id number]  Date of Service: 02/09/2022  Chief Complaint  Patient presents with   Follow-up    Follow up from ER on Thur. High BP and low potassium. Feeling like she did Thur. Work is very stressful. 2:00. Tingling lips, head is tingling, has a fear that same thing that happened Clovis Cao. is going to happen today which was jaw pain. Does have some chest tightness just today and Thur.     46 y/o F presents to the clinic for c/o a f/u visit from the ER last week. Pt was seen in ER last week due to "tingling" sensation of the lip and elevated BP. Pt was d/c home after BP readings in the ER was within normal range and negative troponin test as well as EKG without any acute changes. Today she is at the clinic because of the same tingling sensation of the lip. She states she is very stressed at workplace. Because of stress and anxiety she has difficulty sleeping at night. She stays up thinking of work and wakes up thinking of tasks at work. She has a BP cuff at home and her BP readings have been in the low 130s/low 80s, but when she goes home during lunch to check her BP her readings are 150s/90s. She has discussed stress at work with her supervisor. Currently denies headaches, speech difficulty, nausea, vomiting, dizziness, photosensitivity.       Current Medication:  Outpatient Encounter Medications as of 02/08/2022  Medication Sig   aspirin EC 81 MG tablet Take 81 mg by mouth daily. Swallow whole.   escitalopram (LEXAPRO) 10 MG tablet Take 1 tablet (10 mg total) by mouth daily. Start with 1/2 tablet by mouth once daily x 7days then 1 tab by mouth daily following days   hydrochlorothiazide (HYDRODIURIL) 12.5 MG tablet Take 1 tablet (12.5 mg total) by mouth daily.   hydrOXYzine (ATARAX) 10 MG tablet Take 1 tablet (10 mg total) by mouth 3  (three) times daily as needed for anxiety.   lisinopril (ZESTRIL) 10 MG tablet Take 1 tablet (10 mg total) by mouth daily.   Multiple Vitamin (MULTIVITAMIN) tablet Take 1 tablet by mouth daily.     acetaminophen (TYLENOL) 325 MG tablet Take 650 mg by mouth every 6 (six) hours as needed. (Patient not taking: Reported on 02/08/2022)   No facility-administered encounter medications on file as of 02/08/2022.      Medical History: Past Medical History:  Diagnosis Date   GERD (gastroesophageal reflux disease)    ??mild LPR symptoms   History of abnormal Pap smear 2013   Obesity      Vital Signs: BP (!) 148/94 (BP Location: Left Arm, Patient Position: Sitting, Cuff Size: Normal)   Pulse 87   Temp 98.4 F (36.9 C) (Tympanic)   Ht 5\' 9"  (1.753 m)   Wt 246 lb (111.6 kg)   LMP 01/14/2022   SpO2 98%   BMI 36.33 kg/m    Review of Systems  Constitutional: Negative.   Respiratory: Negative.    Cardiovascular: Negative.   Gastrointestinal: Negative.   Neurological: Negative.   Psychiatric/Behavioral:  Positive for sleep disturbance. Negative for agitation, behavioral problems, confusion, self-injury and suicidal ideas. The patient is nervous/anxious.     Physical Exam Constitutional:      Appearance: Normal appearance.  HENT:  Head: Normocephalic and atraumatic.     Right Ear: External ear normal.     Left Ear: External ear normal.  Eyes:     Extraocular Movements: Extraocular movements intact.  Cardiovascular:     Rate and Rhythm: Normal rate and regular rhythm.  Pulmonary:     Effort: Pulmonary effort is normal.     Breath sounds: Normal breath sounds.  Musculoskeletal:     Cervical back: Neck supple.  Skin:    General: Skin is warm and dry.  Neurological:     Mental Status: She is alert and oriented to person, place, and time.  Psychiatric:        Mood and Affect: Mood normal.        Behavior: Behavior normal.       Assessment/Plan:  1. HTN, goal below  140/90 - lisinopril (ZESTRIL) 10 MG tablet; Take 1 tablet (10 mg total) by mouth daily.  Dispense: 30 tablet; Refill: 0  2. Elevated blood pressure reading with diagnosis of hypertension  3. Anxiety - escitalopram (LEXAPRO) 10 MG tablet; Take 1 tablet (10 mg total) by mouth daily. Start with 1/2 tablet by mouth once daily x 7days then 1 tab by mouth daily following days  Dispense: 30 tablet; Refill: 0 - hydrOXYzine (ATARAX) 10 MG tablet; Take 1 tablet (10 mg total) by mouth 3 (three) times daily as needed for anxiety.  Dispense: 30 tablet; Refill: 0  4. Stressful workplace - hydrOXYzine (ATARAX) 10 MG tablet; Take 1 tablet (10 mg total) by mouth 3 (three) times daily as needed for anxiety.  Dispense: 30 tablet; Refill: 0  Reviewed by clinical findings with patient. Recommended low salt diet. Reduce processed foods. Increase fresh fruits and veggies in diet.  Exercise daily. Stress management- meditation, yoga, etc. Sleep hygiene including no caffeine after 12pm, reduce screen time, have a routine every night. Continue to monitor BP readings and take BP monitor to her next PCP's appointment. Follow up with [facility] for therapy- pt given contact information for [facility] services. Start Lisinopril for HTN and continue with HCTZ as already taking. Start Escitalopram and Hydroxyzine for anxiety and panic disorder. Continue for worsening symptoms. RTC prn F/U with PCP as scheduled later this week.  General Counseling: Naiah verbalizes understanding of the findings of todays visit and agrees with plan of treatment. I have discussed any further diagnostic evaluation that Linda be needed or ordered today. We also reviewed her medications today. she has been encouraged to call the office with any questions or concerns that should arise related to todays visit.    Time spent:40 Minutes    Gilberto Better, [place] Physician Assistant

## 2022-02-12 ENCOUNTER — Ambulatory Visit: Payer: BC Managed Care – PPO | Admitting: Nurse Practitioner

## 2022-02-12 ENCOUNTER — Telehealth: Payer: Self-pay

## 2022-02-12 VITALS — BP 134/82 | HR 76 | Temp 97.2°F | Resp 14 | Ht 69.0 in | Wt 249.4 lb

## 2022-02-12 DIAGNOSIS — Z6836 Body mass index (BMI) 36.0-36.9, adult: Secondary | ICD-10-CM

## 2022-02-12 DIAGNOSIS — I10 Essential (primary) hypertension: Secondary | ICD-10-CM

## 2022-02-12 DIAGNOSIS — F411 Generalized anxiety disorder: Secondary | ICD-10-CM | POA: Diagnosis not present

## 2022-02-12 DIAGNOSIS — F410 Panic disorder [episodic paroxysmal anxiety] without agoraphobia: Secondary | ICD-10-CM | POA: Diagnosis not present

## 2022-02-12 NOTE — Assessment & Plan Note (Signed)
Patient blood pressure within normal limits today.  Patient currently maintained on hydrochlorothiazide 12.5 mg daily along with lisinopril 10 mg daily.  Patient is on lisinopril 10 mg approximately 5 days.  This was started through her occupational health at work provider.  Patient follow-up 1 month Eksir electrolytes are stable as she was hypokalemic with HCTZ in the emergency department but the addition of lisinopril hopefully potassium will be normal

## 2022-02-12 NOTE — Patient Instructions (Signed)
Nice to see you today I have referred you to therapy, they should reach out to you in the next 2 weeks  I want to see you in 1 month, sooner if you need me

## 2022-02-12 NOTE — Telephone Encounter (Signed)
Per appt notes pt has already been arrived.  [facility] Night - Client Nonclinical Telephone Record  [facility] Client [facility] Night - Client Client Site [facility] - Night Provider Audria Nine- NP Contact Type Call Who Is Calling Patient / Member / Family / Caregiver Caller Name [name] Caller Phone Number [phone] Patient Name Linda Love Patient DOB 1975-04-18 Call Type Message Only Information Provided Reason for Call Request for General Office Information Initial Comment She is running late for her appt, is on the way now. Disp. Time Disposition Final User 02/12/2022 8:01:01 AM General Information Provided Yes Salvatore Marvel Call Closed By: Salvatore Marvel Transaction Date/Time: 02/12/2022 7:59:04 AM (ET

## 2022-02-12 NOTE — Progress Notes (Signed)
Established Patient Office Visit  Subjective   Patient ID: Linda Love, female    DOB: 10-10-1975  Age: 46 y.o. MRN: [id number]  Chief Complaint  Patient presents with   ER follow up    HPI  HTN: Patient was originally seen by me on 11/04/2021.  She was noted to have elevated blood pressure readings at home were borderline we continued her HCTZ 12.5 mg and she will work on lifestyle modifications. Since her emergency department visit patient was seen by occupational health and started on lisinopril 10 mg along with continuing her HCTZ 12.5 mg.  Patient's been on medication less than a week.  She does have a blood pressure cuff from home with her today.  At end of visit patient rechecked her blood pressure with her cuff it was 157/97.  Hospital follow up: Patient was seen in the emergency department 02/04/2022 with facial numbness and tingling.  She was noticed to have uncontrolled high blood pressure.  They did the EKG and troponins which were negative and discharge patient.  She was seen by occupational health and started on lisinopril 10 mg along with Lexapro 10 mg and hydroxyzine 10 mg as needed.  Patient has had lots of stress at work per previous office note.  Here today to follow-up with me. Symptoms started at work that day she states that she is been stressed lately at work. States 3 oclock that afternoon her lip started tingling and she took a walk that did not help. States that she was feeling pressure/soreness to the neck. States that after she left work she felt heavy/exhausted. States that after she got home and ate dinner and her jaws started hurting. States that she did check her bp at home but it was high and states hypertensive emergency.  The patient states she went to urgent care they did inform her that they would not see her we will check her blood pressure and if elevated will send her to the emergency department.  This is how that played out and patient was evaluated in the  emergency department.    Review of Systems  Constitutional:  Negative for chills and fever.  Respiratory:  Negative for shortness of breath.   Cardiovascular:  Negative for chest pain.  Neurological:  Positive for dizziness and headaches. Negative for tingling.  Psychiatric/Behavioral:  Negative for hallucinations and suicidal ideas.       Objective:     BP 134/82   Pulse 76   Temp (!) 97.2 F (36.2 C)   Resp 14   Ht 5\' 9"  (1.753 m)   Wt 249 lb 6 oz (113.1 kg)   LMP 01/14/2022   SpO2 98%   BMI 36.83 kg/m    Physical Exam Vitals and nursing note reviewed.  Constitutional:      Appearance: Normal appearance.  Cardiovascular:     Rate and Rhythm: Normal rate and regular rhythm.  Pulmonary:     Effort: Pulmonary effort is normal.     Breath sounds: Normal breath sounds.  Neurological:     Mental Status: She is alert.  Psychiatric:        Attention and Perception: Attention normal.        Mood and Affect: Mood is anxious.        Speech: Speech normal.        Behavior: Behavior normal.        Thought Content: Thought content normal. Thought content does not include homicidal or suicidal ideation.  Thought content does not include homicidal or suicidal plan.        Cognition and Memory: Cognition normal.        Judgment: Judgment normal.      No results found for any visits on 02/12/22.    The 10-year ASCVD risk score (Arnett DK, et al., 2019) is: 1%    Assessment & Plan:   Problem List Items Addressed This Visit       Cardiovascular and Mediastinum   HTN, goal below 140/90    Patient blood pressure within normal limits today.  Patient currently maintained on hydrochlorothiazide 12.5 mg daily along with lisinopril 10 mg daily.  Patient is on lisinopril 10 mg approximately 5 days.  This was started through her occupational health at work provider.  Patient follow-up 1 month Eksir electrolytes are stable as she was hypokalemic with HCTZ in the emergency  department but the addition of lisinopril hopefully potassium will be normal        Other   Obesity   Panic    Do not think she has reached the level of panic as of yet.  She does have hydroxyzine to use as needed did discuss the nature of his medication that is nonhabit-forming and non addicting.  Did give sedation precautions in regards to this medication.      GAD (generalized anxiety disorder) - Primary    Patient is under lots of stress at work as of late.  She has been evaluated by her occupational health provider and was started on Lexapro 10 mg daily with hydroxyzine 10 mg as needed.  The patient has not used hydroxyzine as of yet.  PHQ-9 and GAD-7 administered office.  Patient denies HI/SI/AVH.  Ambulatory referral to psychology for therapy ordered today.      Relevant Orders   Ambulatory referral to Psychology    35 minutes was spent face-to-face with this patient providing counseling and guidance.  Return in about 4 weeks (around 03/12/2022) for BP/GAD recheck .    Audria Nine, NP

## 2022-02-12 NOTE — Assessment & Plan Note (Signed)
Do not think she has reached the level of panic as of yet.  She does have hydroxyzine to use as needed did discuss the nature of his medication that is nonhabit-forming and non addicting.  Did give sedation precautions in regards to this medication.

## 2022-02-12 NOTE — Assessment & Plan Note (Signed)
Patient is under lots of stress at work as of late.  She has been evaluated by her occupational health provider and was started on Lexapro 10 mg daily with hydroxyzine 10 mg as needed.  The patient has not used hydroxyzine as of yet.  PHQ-9 and GAD-7 administered office.  Patient denies HI/SI/AVH.  Ambulatory referral to psychology for therapy ordered today.

## 2022-02-28 ENCOUNTER — Other Ambulatory Visit: Payer: Self-pay | Admitting: Adult Health

## 2022-02-28 DIAGNOSIS — I10 Essential (primary) hypertension: Secondary | ICD-10-CM

## 2022-03-08 ENCOUNTER — Ambulatory Visit: Payer: BC Managed Care – PPO | Admitting: Nurse Practitioner

## 2022-03-08 ENCOUNTER — Encounter: Payer: Self-pay | Admitting: Nurse Practitioner

## 2022-03-08 VITALS — BP 124/70 | HR 73 | Temp 98.2°F | Resp 16 | Ht 69.0 in | Wt 248.5 lb

## 2022-03-08 DIAGNOSIS — F411 Generalized anxiety disorder: Secondary | ICD-10-CM

## 2022-03-08 DIAGNOSIS — Z6835 Body mass index (BMI) 35.0-35.9, adult: Secondary | ICD-10-CM

## 2022-03-08 DIAGNOSIS — F410 Panic disorder [episodic paroxysmal anxiety] without agoraphobia: Secondary | ICD-10-CM

## 2022-03-08 DIAGNOSIS — E66812 Obesity, class 2: Secondary | ICD-10-CM

## 2022-03-08 DIAGNOSIS — I10 Essential (primary) hypertension: Secondary | ICD-10-CM | POA: Diagnosis not present

## 2022-03-08 MED ORDER — HYDROCHLOROTHIAZIDE 12.5 MG PO TABS: 12.5000 mg | ORAL_TABLET | Freq: Every day | ORAL | 1 refills | Status: DC

## 2022-03-08 MED ORDER — LISINOPRIL 10 MG PO TABS: 10.0000 mg | ORAL_TABLET | Freq: Every day | ORAL | 1 refills | Status: DC

## 2022-03-08 NOTE — Assessment & Plan Note (Signed)
Patient currently maintained on hydrochlorothiazide and lisinopril.  Will check BMP today pending result.  Blood pressure within normal limit patient tolerating medications well.

## 2022-03-08 NOTE — Progress Notes (Signed)
Established Patient Office Visit  Subjective   Patient ID: Linda Love, female    DOB: 12-10-1975  Age: 46 y.o. MRN: [id number]  Chief Complaint  Patient presents with   Hypertension   Anxiety       HTN: states that she does have a BP cuff at home. States she has checked it but not too often. She is tolerating the medication well. States that hshe will have episodes of lightheadedness. This is a couple times a week. With head movements and  Anxiety: States that she is still busy with work. 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  That she did take the lexapro for a week. Did not like the way it made her feel. She stopped taking it. She has not taken any of the hydroxyzine. States that she has not done therpay but would like to at the first of the year.  Obesity: Patient states has been going to a med spot and getting semaglutide injections for the past 2 weeks currently on 0.25 mg.  Patient was curious if we could do that her office.  Patient's BMI is 36.7% she does have high blood pressure cholesterol was within normal limits.  Patient will call her insurance to see if Reginal Lutes is a covered product.  Patient has no history of medullary thyroid cancer, multiple endocrine neoplasia syndrome type II, pancreatitis.   Review of Systems  Constitutional:  Negative for chills and fever.  Respiratory:  Negative for shortness of breath.   Cardiovascular:  Negative for chest pain.  Gastrointestinal:  Positive for constipation and nausea. Negative for abdominal pain and vomiting.  Psychiatric/Behavioral:  Negative for hallucinations and suicidal ideas.       Objective:     BP 124/70   Pulse 73   Temp 98.2 F (36.8 C)   Resp 16   Ht 5\' 9"  (1.753 m)   Wt 248 lb 8 oz (112.7 kg)   LMP 03/03/2022 (Approximate)   SpO2 98%   BMI 36.70 kg/m    Physical Exam Vitals and nursing note reviewed.  Constitutional:      Appearance: Normal appearance.   Cardiovascular:     Rate and Rhythm: Normal rate and regular rhythm.     Heart sounds: Normal heart sounds.  Pulmonary:     Effort: Pulmonary effort is normal.     Breath sounds: Normal breath sounds.  Neurological:     Mental Status: She is alert.  Psychiatric:        Mood and Affect: Mood normal.        Behavior: Behavior normal.        Thought Content: Thought content normal.        Judgment: Judgment normal.      No results found for any visits on 03/08/22.    The 10-year ASCVD risk score (Arnett DK, et al., 2019) is: 0.9%    Assessment & Plan:   Problem List Items Addressed This Visit       Cardiovascular and Mediastinum   HTN, goal below 140/90    Patient currently maintained on hydrochlorothiazide and lisinopril.  Will check BMP today pending result.  Blood pressure within normal limit patient tolerating medications well.      Relevant Medications   lisinopril (ZESTRIL) 10 MG tablet   hydrochlorothiazide (HYDRODIURIL) 12.5 MG tablet   Other Relevant Orders   Basic metabolic panel     Other   Obesity  Patient doing semaglutide through a medical spa.  I will treat her with Wegovy.  Patient will reach out to see if this is a covered benefit under her insurance plan.  Patient has no history of medullary thyroid cancer, multiple endocrine neoplasia syndrome type II, pancreatitis.      Panic - Primary   GAD (generalized anxiety disorder)    Patient currently maintained on no medications.  She did discontinue Lexapro which we will keep that way.  Patient does have Atarax if needed has not used it yet.  She will try this at bedtime when she has difficulty sleeping.  Patient has an ambulatory referral to psychology but has not not signed up yet states likely after the first the year.  Patient not having HI/SI/AVH.       Return in about 3 months (around 06/07/2022) for GAD/weight recheck/BP.    Audria Nine, NP

## 2022-03-08 NOTE — Assessment & Plan Note (Signed)
Patient currently maintained on no medications.  She did discontinue Lexapro which we will keep that way.  Patient does have Atarax if needed has not used it yet.  She will try this at bedtime when she has difficulty sleeping.  Patient has an ambulatory referral to psychology but has not not signed up yet states likely after the first the year.  Patient not having HI/SI/AVH.

## 2022-03-08 NOTE — Patient Instructions (Signed)
Nice to see you today Call you insuracne and see if they cover wegovy Follow up with me in 3 months, sooner if you need me

## 2022-03-08 NOTE — Assessment & Plan Note (Signed)
Patient doing semaglutide through a medical spa.  I will treat her with Wegovy.  Patient will reach out to see if this is a covered benefit under her insurance plan.  Patient has no history of medullary thyroid cancer, multiple endocrine neoplasia syndrome type II, pancreatitis.

## 2022-03-09 ENCOUNTER — Encounter: Payer: Self-pay | Admitting: Nurse Practitioner

## 2022-03-09 LAB — BASIC METABOLIC PANEL
BUN: 17 mg/dL (ref 6–23)
CO2: 29 mEq/L (ref 19–32)
Calcium: 9.1 mg/dL (ref 8.4–10.5)
Chloride: 101 mEq/L (ref 96–112)
Creatinine, Ser: 0.61 mg/dL (ref 0.40–1.20)
GFR: 107.7 mL/min (ref >60.00)
Glucose, Bld: 84 mg/dL (ref 70–99)
Potassium: 4.2 mEq/L (ref 3.5–5.1)
Sodium: 137 mEq/L (ref 135–145)

## 2022-03-10 ENCOUNTER — Telehealth: Payer: Self-pay

## 2022-03-10 ENCOUNTER — Other Ambulatory Visit: Payer: Self-pay | Admitting: Nurse Practitioner

## 2022-03-10 ENCOUNTER — Other Ambulatory Visit (HOSPITAL_COMMUNITY): Payer: Self-pay

## 2022-03-10 MED ORDER — WEGOVY 0.25 MG/0.5ML ~~LOC~~ SOAJ: 0.2500 mg | SUBCUTANEOUS | 0 refills | Status: DC

## 2022-03-10 NOTE — Telephone Encounter (Signed)
Pharmacy Patient Advocate Encounter   Received notification from [facility] that prior authorization for [facility] 0.25mg /0.56ml is required/requested.    PA submitted on 03/10/22 to [facility] via CoverMyMeds Key [id number] Status is pending

## 2022-03-12 ENCOUNTER — Other Ambulatory Visit (HOSPITAL_COMMUNITY): Payer: Self-pay

## 2022-03-12 NOTE — Telephone Encounter (Signed)
Patient Advocate Encounter  Prior Authorization for [employer] 0.25mg /0.52ml has been approved.    Effective dates: 03/10/22 through 0416/24  Placed a call to [facility] to notify of the approval. Spoke with Judeth Cornfield and she stated the medication is on a nationwide backorder and it will be filled once some come in.

## 2022-05-03 ENCOUNTER — Other Ambulatory Visit: Payer: Self-pay | Admitting: Nurse Practitioner

## 2022-05-03 DIAGNOSIS — I10 Essential (primary) hypertension: Secondary | ICD-10-CM

## 2022-05-13 ENCOUNTER — Encounter: Payer: Self-pay | Admitting: Nurse Practitioner

## 2022-05-13 ENCOUNTER — Ambulatory Visit: Payer: BC Managed Care – PPO | Admitting: Nurse Practitioner

## 2022-05-13 VITALS — BP 112/78 | HR 80 | Temp 98.5°F | Ht 69.0 in | Wt 249.4 lb

## 2022-05-13 DIAGNOSIS — R5383 Other fatigue: Secondary | ICD-10-CM

## 2022-05-13 DIAGNOSIS — R051 Acute cough: Secondary | ICD-10-CM | POA: Diagnosis not present

## 2022-05-13 DIAGNOSIS — R0981 Nasal congestion: Secondary | ICD-10-CM

## 2022-05-13 DIAGNOSIS — R0982 Postnasal drip: Secondary | ICD-10-CM

## 2022-05-13 DIAGNOSIS — J069 Acute upper respiratory infection, unspecified: Secondary | ICD-10-CM

## 2022-05-13 DIAGNOSIS — R070 Pain in throat: Secondary | ICD-10-CM

## 2022-05-13 LAB — POCT INFLUENZA A/B
Influenza A, POC: NEGATIVE
Influenza B, POC: NEGATIVE

## 2022-05-13 LAB — POCT RAPID STREP A (OFFICE): Rapid Strep A Screen: NEGATIVE

## 2022-05-13 LAB — POC COVID19 BINAXNOW: SARS Coronavirus 2 Ag: NEGATIVE

## 2022-05-13 MED ORDER — FLUTICASONE PROPIONATE 50 MCG/ACT NA SUSP: 2.0000 | Freq: Every day | NASAL | 0 refills | Status: DC

## 2022-05-13 NOTE — Patient Instructions (Signed)
Nice to see you today Your flu, covid, and strep tests were all negative in office I have sent in some nasal spray. You can continue the over the counter medications if they are being beneficial This should start to move out by day 7-10, if not let me know

## 2022-05-13 NOTE — Assessment & Plan Note (Signed)
Strep test in office

## 2022-05-13 NOTE — Assessment & Plan Note (Signed)
Fluticasone nasal spray sent to local pharmacy

## 2022-05-13 NOTE — Assessment & Plan Note (Signed)
Flu and COVID-negative.  Can using over-the-counter medications as beneficial

## 2022-05-13 NOTE — Progress Notes (Signed)
Acute Office Visit  Subjective:     Patient ID: Linda Love, female    DOB: 30-Mar-1975, 47 y.o.   MRN: [id number]  Chief Complaint  Patient presents with   Acute Visit    Swollen glands, congestion, scratchy throat, Yellow mucous  X 3days    HPI Patient is in today for sick symptoms with a history of HTN, GERD, GAD, and obesity   Symptoms started on Monday this round States that sick contact but they have not been seen by provider No Covid test at home  No covid vaccine No flu vaccine States that she has been taking coricden HBP, has not taken any of that today. States tahts he has taken mucinex. States some relief   Review of Systems  Constitutional:  Positive for malaise/fatigue. Negative for chills and fever.       Appetite is normal Fluid intake is lower than normal   HENT:  Positive for congestion and ear pain (popping some fullness). Negative for sinus pain and sore throat (scratchy).        PND  Respiratory:  Positive for cough and sputum production. Negative for shortness of breath.   Gastrointestinal:  Negative for abdominal pain, diarrhea, nausea and vomiting.  Musculoskeletal:  Negative for joint pain and myalgias.  Neurological:  Negative for headaches.        Objective:    BP 112/78   Pulse 80   Temp 98.5 F (36.9 C) (Oral)   Ht 5' 9"$  (1.753 m)   Wt 249 lb 6.4 oz (113.1 kg)   LMP 04/27/2022   SpO2 99%   BMI 36.83 kg/m    Physical Exam Vitals and nursing note reviewed.  Constitutional:      Appearance: Normal appearance.  HENT:     Right Ear: Tympanic membrane, ear canal and external ear normal.     Left Ear: Ear canal and external ear normal.     Ears:     Comments: Clear fluid behind left tm    Mouth/Throat:     Mouth: Mucous membranes are moist.     Pharynx: Oropharynx is clear. Posterior oropharyngeal erythema present.  Cardiovascular:     Rate and Rhythm: Normal rate and regular rhythm.     Heart sounds: Normal heart sounds.   Pulmonary:     Effort: Pulmonary effort is normal.     Breath sounds: Normal breath sounds.  Lymphadenopathy:     Cervical: Cervical adenopathy present.  Neurological:     Mental Status: She is alert.     Results for orders placed or performed in visit on 05/13/22  POCT Influenza A/B  Result Value Ref Range   Influenza A, POC Negative Negative   Influenza B, POC Negative Negative  POC COVID-19 BinaxNow  Result Value Ref Range   SARS Coronavirus 2 Ag Negative Negative  POCT rapid strep A  Result Value Ref Range   Rapid Strep A Screen Negative Negative        Assessment & Plan:   Problem List Items Addressed This Visit       Respiratory   Upper respiratory tract infection    Flu COVID and strep negative in office.  Discussed patient this will likely take 7 to 10 days to clear but she should start improving on her own.  She continues over-the-counter analgesics as needed over-the-counter cold and flu medications as needed and beneficial.  Rest drink plenty of fluid        Other  Acute cough    Flu and COVID-negative in office.  Continue using over-the-counter medications as beneficial      Relevant Medications   fluticasone (FLONASE) 50 MCG/ACT nasal spray   Other Relevant Orders   POCT Influenza A/B (Completed)   POC COVID-19 BinaxNow (Completed)   POCT rapid strep A (Completed)   Other fatigue   Relevant Orders   POCT Influenza A/B (Completed)   POC COVID-19 BinaxNow (Completed)   POCT rapid strep A (Completed)   Nasal congestion - Primary    Flu and COVID-negative.  Can using over-the-counter medications as beneficial      Relevant Medications   fluticasone (FLONASE) 50 MCG/ACT nasal spray   Other Relevant Orders   POCT Influenza A/B (Completed)   POC COVID-19 BinaxNow (Completed)   POCT rapid strep A (Completed)   PND (post-nasal drip)    Fluticasone nasal spray sent to local pharmacy      Relevant Medications   fluticasone (FLONASE) 50 MCG/ACT  nasal spray   Throat discomfort    Strep test in office       Meds ordered this encounter  Medications   fluticasone (FLONASE) 50 MCG/ACT nasal spray    Sig: Place 2 sprays into both nostrils daily.    Dispense:  16 g    Refill:  0    Order Specific Question:   Supervising Provider    Answer:   Loura Pardon A [[id number]]    Return if symptoms worsen or fail to improve, for As scheduled.  Romilda Garret, NP

## 2022-05-13 NOTE — Assessment & Plan Note (Signed)
Flu and COVID-negative in office.  Continue using over-the-counter medications as beneficial

## 2022-05-13 NOTE — Assessment & Plan Note (Signed)
Flu COVID and strep negative in office.  Discussed patient this will likely take 7 to 10 days to clear but she should start improving on her own.  She continues over-the-counter analgesics as needed over-the-counter cold and flu medications as needed and beneficial.  Rest drink plenty of fluid

## 2022-06-07 ENCOUNTER — Ambulatory Visit: Payer: BC Managed Care – PPO | Admitting: Nurse Practitioner

## 2022-06-07 ENCOUNTER — Encounter: Payer: Self-pay | Admitting: Nurse Practitioner

## 2022-06-07 VITALS — BP 116/68 | HR 76 | Temp 98.2°F | Resp 16 | Ht 69.0 in | Wt 257.4 lb

## 2022-06-07 DIAGNOSIS — I10 Essential (primary) hypertension: Secondary | ICD-10-CM | POA: Diagnosis not present

## 2022-06-07 DIAGNOSIS — F411 Generalized anxiety disorder: Secondary | ICD-10-CM | POA: Diagnosis not present

## 2022-06-07 DIAGNOSIS — E669 Obesity, unspecified: Secondary | ICD-10-CM | POA: Diagnosis not present

## 2022-06-07 NOTE — Assessment & Plan Note (Signed)
Patient currently maintained on lisinopril 10 mg hydrochlorothiazide 12.5 mg.  Blood pressure within normal limits.  Continue medication as prescribed

## 2022-06-07 NOTE — Assessment & Plan Note (Signed)
Patient's PA for [facility] was approved.  We did discuss starting it versus going to healthy weight and wellness clinic patient will see if pharmacy has medication in stock and will let me know if she starts medication or needs a new prescription.  She can also follow-up with healthy weight loss at her leisure information given at discharge

## 2022-06-07 NOTE — Progress Notes (Signed)
Established Patient Office Visit  Subjective   Patient ID: Linda Love, female    DOB: [birth year]  Age: 47 y.o. MRN: [id number]  Chief Complaint  Patient presents with   Anxiety    Follow up      Anxiety: Patient was maintained on Lexapro and hydroxyzine.  We had discontinued Lexapro in the last office visit.  Patient had hydroxyzine on a as needed basis. States that she has gotten some part time help at work and has helped with the stress level.  Hypertension: Patient currently maintained on lisinopril hydrochlorothiazide.  Last time we did check labs which were normal limits.  Last office visit blood pressure was also within normal limits.  Obesity: Patient admits that she was getting semaglutide through medical spa.  She was supposed to call her insurance to see if Mancel Parsons was covered.  It was a covered benefit and the PA was approved on 03/10/2022. Patient states her pharmacy did not have the medication she did not proceed with the pharmacist to see if they have it she will check today to see if the pharmacy has medication.  Patient has gained weight since last office visit    Review of Systems  Constitutional:  Negative for chills and fever.  Respiratory:  Negative for shortness of breath.   Cardiovascular:  Negative for chest pain.  Neurological:  Negative for headaches.  Psychiatric/Behavioral:  Negative for hallucinations and suicidal ideas.       Objective:     BP 116/68   Pulse 76   Temp 98.2 F (36.8 C)   Resp 16   Ht '5\' 9"'$  (1.753 m)   Wt 257 lb 6 oz (116.7 kg)   LMP 05/20/2022 (Exact Date)   SpO2 97%   BMI 38.01 kg/m  BP Readings from Last 3 Encounters:  06/07/22 116/68  05/13/22 112/78  03/08/22 124/70   Wt Readings from Last 3 Encounters:  06/07/22 257 lb 6 oz (116.7 kg)  05/13/22 249 lb 6.4 oz (113.1 kg)  03/08/22 248 lb 8 oz (112.7 kg)      Physical Exam Vitals and nursing note reviewed.  Constitutional:      Appearance: Normal  appearance.  Cardiovascular:     Rate and Rhythm: Normal rate and regular rhythm.     Heart sounds: Normal heart sounds.  Pulmonary:     Effort: Pulmonary effort is normal.     Breath sounds: Normal breath sounds.  Neurological:     Mental Status: She is alert.      No results found for any visits on 06/07/22.    The 10-year ASCVD risk score (Arnett DK, et al., 2019) is: 0.8%    Assessment & Plan:   Problem List Items Addressed This Visit       Cardiovascular and Mediastinum   HTN, goal below 140/90 - Primary    Patient currently maintained on lisinopril 10 mg hydrochlorothiazide 12.5 mg.  Blood pressure within normal limits.  Continue medication as prescribed        Other   Obesity (BMI 30-39.9)    Patient's PA for [facility] was approved.  We did discuss starting it versus going to healthy weight and wellness clinic patient will see if pharmacy has medication in stock and will let me know if she starts medication or needs a new prescription.  She can also follow-up with healthy weight loss at her leisure information given at discharge      GAD (generalized anxiety disorder)  Patient currently maintained on hydroxyzine as needed.  Continue medication       Return in about 4 months (around 10/07/2022) for CPE and Labs.    Romilda Garret, NP

## 2022-06-07 NOTE — Assessment & Plan Note (Signed)
Patient currently maintained on hydroxyzine as needed.  Continue medication

## 2022-06-07 NOTE — Patient Instructions (Addendum)
Nice to see you today Hydroxyzine (atarax/vistaril) is the one that is used as needed for anxiety Let me know about the [facility] weight and wellness clinic   [address], [place], [place] [address]  ~2.4 mi [phone]

## 2022-07-06 ENCOUNTER — Ambulatory Visit: Payer: Self-pay

## 2022-07-06 NOTE — Progress Notes (Unsigned)
Nutrition Consult: 07/06/2022  CC: I am overweight.  I weigh more than I have ever weighted.  I need to get back on track.  I am starting to have some joint pain with the extra weight.  HT: 5'9"  WT: 257 lb  BMI: 37.9  HX: Currently working at the [employer].  The job has with a number of demands requiring completing many task in the same day that funds are received.  This at times is stressful.  Recently, her older sister "had a heart attack that they describes as a widow maker." She reports the sister is doing well now.  She notes that this makes her a candidate for possible heredity cardiac issues. Has a h/x of HTN, GERD, and generalized anxiety disorder. Lives with her mother near the [place] [facility]. Notes that her mother was glad that she is seeking help and is wiling to support her in weight loss.  Dietary Recall: B: Coffee with cream, cup of Greek yogurt with blueberries. OR 1 slice whole wheat toast with PB and a banana. Snack: Couple of homemade Ritz PB and crackers with a few grapes.  Lunch:  Will drive home if time will allow.  Has a sandwich or today, some soft tacos, with the spiced hamburger meat, lettuce, tomato, cheddar cheese.  OR go to the cafe and have a salad .  Usually water or unsweetened tea with lemon to drink. Snack: No usual snack unless "my stomach growls and I will have a couple of the homemade PB and crackers." Dinner: Hard tacos, made a salad and used 4 shells, had the lettuce tomato, avocado, and cheddar cheese. " Dinner is often my reward for getting through the day."  Snack: Easter candy with a cup of coffee. Or Cookies, pie or cake.  Intervention: Review of the mechanism of weight gain related to decreased activity, higher calorie intake and emotional stress.  Recommendations: Use whole wheat breads or low carb tortilla. Increase vegetable intake and decrease bread intake  Eat less red meat and more poultry and fish. Avoid concentrated  sweets.  Cut the amount in half. Keep up your water intake. Use fruit for dessert and snacks. Start walking after each evening meal. Aim for a weight loss of 57 lb in the next 12 months.  See Linda back in one month  Linda Ellice Boultinghouse, RN, RD, LDN

## 2022-07-15 DIAGNOSIS — H5213 Myopia, bilateral: Secondary | ICD-10-CM | POA: Diagnosis not present

## 2022-07-15 DIAGNOSIS — H52223 Regular astigmatism, bilateral: Secondary | ICD-10-CM | POA: Diagnosis not present

## 2022-07-15 DIAGNOSIS — H33322 Round hole, left eye: Secondary | ICD-10-CM | POA: Diagnosis not present

## 2022-07-15 DIAGNOSIS — H524 Presbyopia: Secondary | ICD-10-CM | POA: Diagnosis not present

## 2022-07-16 DIAGNOSIS — H43821 Vitreomacular adhesion, right eye: Secondary | ICD-10-CM | POA: Diagnosis not present

## 2022-07-16 DIAGNOSIS — H33322 Round hole, left eye: Secondary | ICD-10-CM | POA: Diagnosis not present

## 2022-07-16 DIAGNOSIS — H43812 Vitreous degeneration, left eye: Secondary | ICD-10-CM | POA: Diagnosis not present

## 2022-07-27 DIAGNOSIS — H33322 Round hole, left eye: Secondary | ICD-10-CM | POA: Diagnosis not present

## 2022-08-03 ENCOUNTER — Encounter: Payer: Self-pay | Admitting: Nurse Practitioner

## 2022-08-04 MED ORDER — HYDROXYZINE HCL 10 MG PO TABS: 10.0000 mg | ORAL_TABLET | Freq: Three times a day (TID) | ORAL | 0 refills | Status: DC | PRN

## 2022-08-05 ENCOUNTER — Ambulatory Visit: Payer: Self-pay

## 2022-08-10 ENCOUNTER — Ambulatory Visit: Payer: Self-pay

## 2022-08-25 DIAGNOSIS — H31012 Macula scars of posterior pole (postinflammatory) (post-traumatic), left eye: Secondary | ICD-10-CM | POA: Diagnosis not present

## 2022-08-25 DIAGNOSIS — H43812 Vitreous degeneration, left eye: Secondary | ICD-10-CM | POA: Diagnosis not present

## 2022-08-25 DIAGNOSIS — H43821 Vitreomacular adhesion, right eye: Secondary | ICD-10-CM | POA: Diagnosis not present

## 2022-08-31 ENCOUNTER — Other Ambulatory Visit: Payer: Self-pay | Admitting: Nurse Practitioner

## 2022-08-31 DIAGNOSIS — I10 Essential (primary) hypertension: Secondary | ICD-10-CM

## 2022-10-11 ENCOUNTER — Encounter: Payer: Self-pay | Admitting: Nurse Practitioner

## 2022-11-05 ENCOUNTER — Other Ambulatory Visit: Payer: Self-pay | Admitting: Nurse Practitioner

## 2022-11-05 DIAGNOSIS — I10 Essential (primary) hypertension: Secondary | ICD-10-CM

## 2022-11-05 NOTE — Telephone Encounter (Signed)
LAST APPOINTMENT DATE: 06/07/22   NEXT APPOINTMENT DATE: Visit date not found  Lisinopril 10mg   LAST REFILL: 05/04/22   QTY: #90 1RF

## 2023-01-30 ENCOUNTER — Other Ambulatory Visit: Payer: Self-pay | Admitting: Nurse Practitioner

## 2023-01-30 DIAGNOSIS — I10 Essential (primary) hypertension: Secondary | ICD-10-CM

## 2023-01-31 NOTE — Telephone Encounter (Signed)
Patient scheduled.

## 2023-01-31 NOTE — Telephone Encounter (Signed)
Can we call and get patient setup for a CPE with in the next 90 days please

## 2023-02-10 ENCOUNTER — Encounter: Payer: Self-pay | Admitting: Nurse Practitioner

## 2023-02-10 ENCOUNTER — Ambulatory Visit (INDEPENDENT_AMBULATORY_CARE_PROVIDER_SITE_OTHER): Payer: Managed Care, Other (non HMO) | Admitting: Nurse Practitioner

## 2023-02-10 VITALS — BP 126/82 | HR 89 | Temp 97.8°F | Ht 69.0 in | Wt 257.6 lb

## 2023-02-10 DIAGNOSIS — Z23 Encounter for immunization: Secondary | ICD-10-CM | POA: Diagnosis not present

## 2023-02-10 DIAGNOSIS — F411 Generalized anxiety disorder: Secondary | ICD-10-CM

## 2023-02-10 DIAGNOSIS — E669 Obesity, unspecified: Secondary | ICD-10-CM

## 2023-02-10 DIAGNOSIS — Z0000 Encounter for general adult medical examination without abnormal findings: Secondary | ICD-10-CM | POA: Diagnosis not present

## 2023-02-10 DIAGNOSIS — Z8249 Family history of ischemic heart disease and other diseases of the circulatory system: Secondary | ICD-10-CM | POA: Insufficient documentation

## 2023-02-10 DIAGNOSIS — I10 Essential (primary) hypertension: Secondary | ICD-10-CM

## 2023-02-10 DIAGNOSIS — Z13220 Encounter for screening for lipoid disorders: Secondary | ICD-10-CM

## 2023-02-10 MED ORDER — BUSPIRONE HCL 5 MG PO TABS: 5.0000 mg | ORAL_TABLET | Freq: Two times a day (BID) | ORAL | 1 refills | Status: DC

## 2023-02-10 MED ORDER — HYDROXYZINE HCL 10 MG PO TABS: 10.0000 mg | ORAL_TABLET | Freq: Three times a day (TID) | ORAL | 2 refills | Status: AC | PRN

## 2023-02-10 NOTE — Assessment & Plan Note (Signed)
Pending lipid and lipoprotein a.  Can consider cardiac CT prior to statin medication

## 2023-02-10 NOTE — Assessment & Plan Note (Signed)
Continue working lifestyle applications pending TSH, A1c, lipid panel.

## 2023-02-10 NOTE — Assessment & Plan Note (Signed)
Patient currently maintained on lisinopril and hydrochlorothiazide.  Blood pressure under good control.  Continue medication as prescribed

## 2023-02-10 NOTE — Patient Instructions (Signed)
Nice to see you today Make a fasting lab appointment over the next 2 weeks Follow up with me in 3 weeks We did update your tetanus vaccine

## 2023-02-10 NOTE — Progress Notes (Signed)
Established Patient Office Visit  Subjective   Patient ID: Linda Love, female    DOB: Dec 11, 1975  Age: 47 y.o. MRN: [id number]  Chief Complaint  Patient presents with   Annual Exam    HPI  HTN: lisinopril and hydrochlorothiazide. States that she has not been checking her bp at home. Statse that she has had some stress days at work that gave tightness in chest   GERD: history of the same and does not require medicatoins  GAD history of the same. She has hydrozyxine. States that she has a stressful job. States that she has taken the medication and is currently out.  She has gotten some over-the-counter actual Lenell Antu preparation seems to have some benefit.  She was prescribed Lexapro in the past but never started the medication from her previous employers employee health   for complete physical and follow up of chronic conditions.  Immunizations: -Tetanus: Completed in 2008, update today -Influenza: refused  -Shingles: Too young -Pneumonia: Too young - covid: refused   Diet: Fair diet. 3 meals and bedtime snack. She wll drink coffee water and unsweet tea  Exercise: No regular exercise.  Eye exam: Completes annually. Glasses and up to date  Dental exam: PRN  Colonoscopy: Referred last year patient never scheduled.  Would like to hold off right now as she may be transitioning employment Lung Cancer Screening: N/A  Pap smear:hx of abnormal 12/24/2021 with repeat in 2026  Mammogram: through [facility] done on 01/21/2022.  Offered This year patient deferred at current juncture  Dexa: Too young      Review of Systems  Constitutional:  Negative for chills and fever.  Respiratory:  Negative for shortness of breath.   Cardiovascular:  Negative for chest pain and leg swelling.  Gastrointestinal:  Negative for abdominal pain, blood in stool, constipation, diarrhea, nausea and vomiting.       BM daily   Genitourinary:  Negative for dysuria and hematuria.  Neurological:  Negative  for tingling and headaches.  Psychiatric/Behavioral:  Negative for hallucinations and suicidal ideas.       Objective:     BP 126/82   Pulse 89   Temp 97.8 F (36.6 C)   Ht 5\' 9"  (1.753 m)   Wt 257 lb 9.6 oz (116.8 kg)   SpO2 99%   BMI 38.04 kg/m  BP Readings from Last 3 Encounters:  02/10/23 126/82  06/07/22 116/68  05/13/22 112/78   Wt Readings from Last 3 Encounters:  02/10/23 257 lb 9.6 oz (116.8 kg)  06/07/22 257 lb 6 oz (116.7 kg)  05/13/22 249 lb 6.4 oz (113.1 kg)   SpO2 Readings from Last 3 Encounters:  02/10/23 99%  06/07/22 97%  05/13/22 99%      Physical Exam Vitals and nursing note reviewed.  Constitutional:      Appearance: Normal appearance.  HENT:     Right Ear: Tympanic membrane, ear canal and external ear normal.     Left Ear: Tympanic membrane, ear canal and external ear normal.     Mouth/Throat:     Mouth: Mucous membranes are moist.     Pharynx: Oropharynx is clear.  Eyes:     Extraocular Movements: Extraocular movements intact.     Pupils: Pupils are equal, round, and reactive to light.  Cardiovascular:     Rate and Rhythm: Normal rate and regular rhythm.     Pulses: Normal pulses.     Heart sounds: Normal heart sounds.  Pulmonary:  Effort: Pulmonary effort is normal.     Breath sounds: Normal breath sounds.  Abdominal:     General: Bowel sounds are normal. There is no distension.     Palpations: There is no mass.     Tenderness: There is no abdominal tenderness.     Hernia: No hernia is present.  Musculoskeletal:     Right lower leg: No edema.     Left lower leg: No edema.  Lymphadenopathy:     Cervical: No cervical adenopathy.  Skin:    General: Skin is warm.  Neurological:     General: No focal deficit present.     Mental Status: She is alert.     Deep Tendon Reflexes:     Reflex Scores:      Bicep reflexes are 2+ on the right side and 2+ on the left side.      Patellar reflexes are 2+ on the right side and 2+ on the  left side.    Comments: Bilateral upper and lower extremity strength 5/5  Psychiatric:        Mood and Affect: Mood normal.        Behavior: Behavior normal.        Thought Content: Thought content normal.        Judgment: Judgment normal.      No results found for any visits on 02/10/23.    The 10-year ASCVD risk score (Arnett DK, et al., 2019) is: 1%    Assessment & Plan:   Problem List Items Addressed This Visit       Cardiovascular and Mediastinum   HTN, goal below 140/90    Patient currently maintained on lisinopril and hydrochlorothiazide.  Blood pressure under good control.  Continue medication as prescribed        Other   Obesity (BMI 30-39.9)    Continue working lifestyle applications pending TSH, A1c, lipid panel.      Relevant Orders   Hemoglobin A1c   Preventative health care - Primary    Discussed age-appropriate immunizations and screening exams.  Did review patient's personal, surgical, social, family histories.  Patient is up-to-date on all age-appropriate vaccinations she would like.  Update tetanus vaccine today.  Patient is due for CRC screening and mammogram.  She is up-to-date on cervical cancer screening.  Patient was given information at discharge about preventative healthcare maintenance with anticipatory guidance.      Relevant Orders   CBC   Comprehensive metabolic panel   TSH   GAD (generalized anxiety disorder)    History of the same.  Will refill hydroxyzine start patient on buspirone 5 mg twice daily.      Relevant Medications   busPIRone (BUSPAR) 5 MG tablet   hydrOXYzine (ATARAX) 10 MG tablet   Other Relevant Orders   TSH   Family history of heart disease    Pending lipid and lipoprotein a.  Can consider cardiac CT prior to statin medication      Relevant Orders   Lipid panel   Lipoprotein A (LPA)   Other Visit Diagnoses     Need for tetanus booster       Relevant Orders   Tdap vaccine greater than or equal to 7yo IM  (Completed)   Screening for lipid disorders       Relevant Orders   Lipid panel   Lipoprotein A (LPA)       Return in about 3 weeks (around 03/03/2023) for GAD/medication recheck .  Audria Nine, NP

## 2023-02-10 NOTE — Assessment & Plan Note (Signed)
Discussed age-appropriate immunizations and screening exams.  Did review patient's personal, surgical, social, family histories.  Patient is up-to-date on all age-appropriate vaccinations she would like.  Update tetanus vaccine today.  Patient is due for CRC screening and mammogram.  She is up-to-date on cervical cancer screening.  Patient was given information at discharge about preventative healthcare maintenance with anticipatory guidance.

## 2023-02-10 NOTE — Assessment & Plan Note (Signed)
History of the same.  Will refill hydroxyzine start patient on buspirone 5 mg twice daily.

## 2023-02-16 ENCOUNTER — Ambulatory Visit: Payer: Managed Care, Other (non HMO) | Admitting: Nurse Practitioner

## 2023-02-16 ENCOUNTER — Other Ambulatory Visit (INDEPENDENT_AMBULATORY_CARE_PROVIDER_SITE_OTHER): Payer: Managed Care, Other (non HMO)

## 2023-02-16 VITALS — BP 116/82 | HR 73 | Temp 98.3°F | Ht 69.0 in | Wt 256.2 lb

## 2023-02-16 DIAGNOSIS — Z13220 Encounter for screening for lipoid disorders: Secondary | ICD-10-CM

## 2023-02-16 DIAGNOSIS — T8090XA Unspecified complication following infusion and therapeutic injection, initial encounter: Secondary | ICD-10-CM

## 2023-02-16 DIAGNOSIS — Z0000 Encounter for general adult medical examination without abnormal findings: Secondary | ICD-10-CM

## 2023-02-16 DIAGNOSIS — Z8249 Family history of ischemic heart disease and other diseases of the circulatory system: Secondary | ICD-10-CM | POA: Diagnosis not present

## 2023-02-16 DIAGNOSIS — E669 Obesity, unspecified: Secondary | ICD-10-CM

## 2023-02-16 DIAGNOSIS — F411 Generalized anxiety disorder: Secondary | ICD-10-CM | POA: Diagnosis not present

## 2023-02-16 LAB — COMPREHENSIVE METABOLIC PANEL
ALT: 12 U/L (ref 0–35)
AST: 13 U/L (ref 0–37)
Albumin: 4.1 g/dL (ref 3.5–5.2)
Alkaline Phosphatase: 57 U/L (ref 39–117)
BUN: 14 mg/dL (ref 6–23)
CO2: 30 meq/L (ref 19–32)
Calcium: 9.2 mg/dL (ref 8.4–10.5)
Chloride: 101 meq/L (ref 96–112)
Creatinine, Ser: 0.67 mg/dL (ref 0.40–1.20)
GFR: 104.6 mL/min (ref >60.00)
Glucose, Bld: 119 mg/dL — ABNORMAL HIGH (ref 70–99)
Potassium: 4.3 meq/L (ref 3.5–5.1)
Sodium: 138 meq/L (ref 135–145)
Total Bilirubin: 0.3 mg/dL (ref 0.2–1.2)
Total Protein: 6.9 g/dL (ref 6.0–8.3)

## 2023-02-16 LAB — CBC
HCT: 41.3 % (ref 36.0–46.0)
Hemoglobin: 13.9 g/dL (ref 12.0–15.0)
MCHC: 33.6 g/dL (ref 30.0–36.0)
MCV: 92.8 fL (ref 78.0–100.0)
Platelets: 256 10*3/uL (ref 150.0–400.0)
RBC: 4.46 Mil/uL (ref 3.87–5.11)
RDW: 12.9 % (ref 11.5–15.5)
WBC: 9.4 10*3/uL (ref 4.0–10.5)

## 2023-02-16 LAB — LIPID PANEL
Cholesterol: 173 mg/dL (ref 0–200)
HDL: 40.4 mg/dL (ref >39.00)
LDL Cholesterol: 113 mg/dL — ABNORMAL HIGH (ref 0–99)
NonHDL: 132.33
Total CHOL/HDL Ratio: 4
Triglycerides: 99 mg/dL (ref 0.0–149.0)
VLDL: 19.8 mg/dL (ref 0.0–40.0)

## 2023-02-16 LAB — TSH: TSH: 5.11 u[IU]/mL (ref 0.35–5.50)

## 2023-02-16 LAB — HEMOGLOBIN A1C: Hgb A1c MFr Bld: 6.1 % (ref 4.6–6.5)

## 2023-02-16 NOTE — Progress Notes (Signed)
Acute Office Visit  Subjective:     Patient ID: Linda Love, female    DOB: Oct 02, 1975, 47 y.o.   MRN: [id number]  Chief Complaint  Patient presents with   Reaction to Tdap Vaccine    Pt states of hard, redness, itchy, only hurts when touched.     HPI Patient is in today for skin issues with a history of HTN, GERD, GAD  Patient was seen by me on 02/10/2023 and got a tetanus vaccine. States that this weekend she noticed some swollen hard bump. Stats that she feels like it has improved some. States that it is itching. States that it will hurt with touch. States that she has not tried anything over-the-counter thus far. Review of Systems  Constitutional:  Negative for chills and fever.  Respiratory:  Negative for shortness of breath.   Cardiovascular:  Negative for chest pain.  Skin:  Positive for itching and rash.        Objective:    BP 116/82   Pulse 73   Temp 98.3 F (36.8 C) (Oral)   Ht 5\' 9"  (1.753 m)   Wt 256 lb 3.2 oz (116.2 kg)   SpO2 96%   BMI 37.83 kg/m    Physical Exam Vitals and nursing note reviewed.  Constitutional:      Appearance: Normal appearance.  Cardiovascular:     Rate and Rhythm: Normal rate and regular rhythm.     Pulses:          Radial pulses are 2+ on the right side and 2+ on the left side.     Heart sounds: Normal heart sounds.  Pulmonary:     Effort: Pulmonary effort is normal.     Breath sounds: Normal breath sounds.  Skin:      Neurological:     Mental Status: She is alert.     Results for orders placed or performed in visit on 02/16/23  Lipid panel  Result Value Ref Range   Cholesterol 173 0 - 200 mg/dL   Triglycerides 95.6 0.0 - 149.0 mg/dL   HDL 21.30 >86.57 mg/dL   VLDL 84.6 0.0 - 96.2 mg/dL   LDL Cholesterol 952 (H) 0 - 99 mg/dL   Total CHOL/HDL Ratio 4    NonHDL 132.33   TSH  Result Value Ref Range   TSH 5.11 0.35 - 5.50 uIU/mL  Hemoglobin A1c  Result Value Ref Range   Hgb A1c MFr Bld 6.1 4.6 - 6.5 %   Comprehensive metabolic panel  Result Value Ref Range   Sodium 138 135 - 145 mEq/L   Potassium 4.3 3.5 - 5.1 mEq/L   Chloride 101 96 - 112 mEq/L   CO2 30 19 - 32 mEq/L   Glucose, Bld 119 (H) 70 - 99 mg/dL   BUN 14 6 - 23 mg/dL   Creatinine, Ser 8.41 0.40 - 1.20 mg/dL   Total Bilirubin 0.3 0.2 - 1.2 mg/dL   Alkaline Phosphatase 57 39 - 117 U/L   AST 13 0 - 37 U/L   ALT 12 0 - 35 U/L   Total Protein 6.9 6.0 - 8.3 g/dL   Albumin 4.1 3.5 - 5.2 g/dL   GFR 324.40 >10.27 mL/min   Calcium 9.2 8.4 - 10.5 mg/dL  CBC  Result Value Ref Range   WBC 9.4 4.0 - 10.5 K/uL   RBC 4.46 3.87 - 5.11 Mil/uL   Platelets 256.0 150.0 - 400.0 K/uL   Hemoglobin 13.9 12.0 - 15.0 g/dL  HCT 41.3 36.0 - 46.0 %   MCV 92.8 78.0 - 100.0 fl   MCHC 33.6 30.0 - 36.0 g/dL   RDW 40.9 81.1 - 91.4 %        Assessment & Plan:   Problem List Items Addressed This Visit       Other   Injection site reaction - Primary    Patient can use ice 20 minutes on 20 minutes off several times throughout the day.  She can use as needed hydrocortisone 1% cream over-the-counter 1 application twice daily.  Patient absolutely needs that she can use antihistamine she does have hydroxyzine that she can use that will help with the itching.  Told patient this can take at least 1 week to start resolving signs and symptoms reviewed when to seek health care advice       No orders of the defined types were placed in this encounter.   Return if symptoms worsen or fail to improve.  Audria Nine, NP

## 2023-02-16 NOTE — Patient Instructions (Addendum)
This is an injection site reaction Using ice for 20 mins on and at least 20 mins off several times a day The hydroxyzine can help with the itching You can use hydrocortisone 1% cream over the counter twice a day if needed  Thsi will take at least 1 week to improve

## 2023-02-16 NOTE — Assessment & Plan Note (Signed)
Patient can use ice 20 minutes on 20 minutes off several times throughout the day.  She can use as needed hydrocortisone 1% cream over-the-counter 1 application twice daily.  Patient absolutely needs that she can use antihistamine she does have hydroxyzine that she can use that will help with the itching.  Told patient this can take at least 1 week to start resolving signs and symptoms reviewed when to seek health care advice

## 2023-02-19 LAB — LIPOPROTEIN A (LPA): Lipoprotein (a): 16 nmol/L (ref <75)

## 2023-02-28 ENCOUNTER — Other Ambulatory Visit: Payer: Self-pay | Admitting: Nurse Practitioner

## 2023-02-28 DIAGNOSIS — I10 Essential (primary) hypertension: Secondary | ICD-10-CM

## 2023-03-03 ENCOUNTER — Ambulatory Visit: Payer: Managed Care, Other (non HMO) | Admitting: Nurse Practitioner

## 2023-03-03 ENCOUNTER — Encounter: Payer: Self-pay | Admitting: Nurse Practitioner

## 2023-03-03 VITALS — BP 120/86 | HR 83 | Temp 98.6°F | Ht 69.0 in | Wt 256.2 lb

## 2023-03-03 DIAGNOSIS — F411 Generalized anxiety disorder: Secondary | ICD-10-CM

## 2023-03-03 DIAGNOSIS — E669 Obesity, unspecified: Secondary | ICD-10-CM | POA: Diagnosis not present

## 2023-03-03 DIAGNOSIS — I10 Essential (primary) hypertension: Secondary | ICD-10-CM

## 2023-03-03 NOTE — Assessment & Plan Note (Addendum)
Patient currently maintained on lisinopril and hydrochlorothiazide.  Patient was wondering if she is still be on HCTZ.  Blood pressure well-controlled at this juncture recommend she stay on the medication.  Patient to continue working on lifestyle modifications if we need to lower medication after that we will.  I did discuss this with patient.

## 2023-03-03 NOTE — Patient Instructions (Addendum)
Nice to see you today I want to see you in 12 months for your next physical, sooner if you need me If you decide that you want to do the CT scan of the heart just reach out to me
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
## 2023-03-03 NOTE — Assessment & Plan Note (Addendum)
History of the same.  Was written Lexapro in the past but never took it.  Has hydroxyzine as needed.  Did start patient on buspirone 5 mg twice daily.  Patient tolerates the medication decently well with some lightheadedness on occasion.  Patient feels like she is not worrying about the small stuff as much.  Continue medication as prescribed.  Patient denies HI/SI/AVH.

## 2023-03-03 NOTE — Assessment & Plan Note (Signed)
Continue working on healthy lifestyle modifications 

## 2023-04-01 DIAGNOSIS — J00 Acute nasopharyngitis [common cold]: Secondary | ICD-10-CM | POA: Diagnosis not present

## 2023-04-01 DIAGNOSIS — Z6837 Body mass index (BMI) 37.0-37.9, adult: Secondary | ICD-10-CM | POA: Diagnosis not present

## 2023-04-09 ENCOUNTER — Other Ambulatory Visit: Payer: Self-pay | Admitting: Nurse Practitioner

## 2023-04-09 DIAGNOSIS — F411 Generalized anxiety disorder: Secondary | ICD-10-CM

## 2023-05-04 ENCOUNTER — Other Ambulatory Visit: Payer: Self-pay | Admitting: Nurse Practitioner

## 2023-05-04 DIAGNOSIS — I10 Essential (primary) hypertension: Secondary | ICD-10-CM

## 2023-05-13 ENCOUNTER — Other Ambulatory Visit: Payer: Self-pay | Admitting: Nurse Practitioner

## 2023-05-13 DIAGNOSIS — F411 Generalized anxiety disorder: Secondary | ICD-10-CM

## 2023-05-27 ENCOUNTER — Other Ambulatory Visit: Payer: Self-pay | Admitting: Nurse Practitioner

## 2023-05-27 DIAGNOSIS — I10 Essential (primary) hypertension: Secondary | ICD-10-CM

## 2023-06-11 ENCOUNTER — Other Ambulatory Visit: Payer: Self-pay | Admitting: Nurse Practitioner

## 2023-06-11 DIAGNOSIS — F411 Generalized anxiety disorder: Secondary | ICD-10-CM

## 2023-08-02 ENCOUNTER — Other Ambulatory Visit: Payer: Self-pay | Admitting: Nurse Practitioner

## 2023-08-02 DIAGNOSIS — I10 Essential (primary) hypertension: Secondary | ICD-10-CM

## 2023-12-11 ENCOUNTER — Other Ambulatory Visit: Payer: Self-pay | Admitting: Nurse Practitioner

## 2023-12-11 DIAGNOSIS — F411 Generalized anxiety disorder: Secondary | ICD-10-CM

## 2023-12-19 ENCOUNTER — Ambulatory Visit (INDEPENDENT_AMBULATORY_CARE_PROVIDER_SITE_OTHER): Admitting: Nurse Practitioner

## 2023-12-19 VITALS — BP 124/80 | HR 77 | Temp 97.6°F | Ht 69.0 in | Wt 237.6 lb

## 2023-12-19 DIAGNOSIS — M25552 Pain in left hip: Secondary | ICD-10-CM | POA: Diagnosis not present

## 2023-12-19 DIAGNOSIS — I10 Essential (primary) hypertension: Secondary | ICD-10-CM | POA: Diagnosis not present

## 2023-12-19 DIAGNOSIS — E669 Obesity, unspecified: Secondary | ICD-10-CM | POA: Diagnosis not present

## 2023-12-19 NOTE — Progress Notes (Signed)
 Established Patient Office Visit  Subjective   Patient ID: Linda Love, female    DOB: 1976-02-25  Age: 48 y.o. MRN: [id number]  Chief Complaint  Patient presents with   Muscle Pain    Pt complains of L leg muscle pain from old injury from 10 years ago. Pt just wants advice on what type of stretches she can do to help.    Medication Management    Pt would like to stop taking her Blood pressure medication.     Discussed the use of AI scribe software for clinical note transcription with the patient, who gave verbal consent to proceed.  History of Present Illness Linda Love is a 48 year old female with hypertension who presents for a follow-up regarding her blood pressure management and leg discomfort.  She started blood pressure medication during a period of high stress and grief. Her stress levels have decreased significantly as she is now in school and not working. She has adopted a keto diet, resulting in a weight loss of 10 to 15 pounds over the past month. She is currently taking hydrochlorothiazide  and occasionally supplements with electrolytes. No dizziness or lightheadedness is reported, and she has the ability to monitor her blood pressure at home.  She has a history of a right ankle sprain approximately ten years ago, which led to overcompensation and subsequent discomfort in the left hip and side of the leg. This discomfort limits her range of motion, particularly when sitting cross-legged, and causes pain at a certain point. She has not previously sought physical therapy or specific exercises for this issue. The discomfort has persisted over the years, though it is not as severe as initially.  She is not currently engaging in any physical activity. She is trying to stay hydrated, aiming for 64 ounces of water daily, but notes she needs to improve in this area.     Review of Systems  Constitutional:  Negative for chills and fever.  Respiratory:  Negative for shortness  of breath.   Cardiovascular:  Negative for chest pain.  Musculoskeletal:  Positive for myalgias.  Neurological:  Negative for tingling and weakness.      Objective:     BP 124/80   Pulse 77   Temp 97.6 F (36.4 C) (Oral)   Ht 5' 9 (1.753 m)   Wt 237 lb 9.6 oz (107.8 kg)   SpO2 95%   BMI 35.09 kg/m  BP Readings from Last 3 Encounters:  12/19/23 124/80  03/03/23 120/86  02/16/23 116/82   Wt Readings from Last 3 Encounters:  12/19/23 237 lb 9.6 oz (107.8 kg)  03/03/23 256 lb 3.2 oz (116.2 kg)  02/16/23 256 lb 3.2 oz (116.2 kg)   SpO2 Readings from Last 3 Encounters:  12/19/23 95%  03/03/23 97%  02/16/23 96%      Physical Exam Vitals and nursing note reviewed.  Constitutional:      Appearance: Normal appearance.  Cardiovascular:     Rate and Rhythm: Normal rate and regular rhythm.     Heart sounds: Normal heart sounds.  Pulmonary:     Effort: Pulmonary effort is normal.     Breath sounds: Normal breath sounds.  Musculoskeletal:     Lumbar back: Positive left straight leg raise test. Negative right straight leg raise test.  Neurological:     General: No focal deficit present.     Mental Status: She is alert.     Deep Tendon Reflexes:  Reflex Scores:      Patellar reflexes are 2+ on the right side and 2+ on the left side.    Comments: Bilateral lower extremity strength 5/5  Decreased abduction and adduction       No results found for any visits on 12/19/23.    The 10-year ASCVD risk score (Arnett DK, et al., 2019) is: 1.5%    Assessment & Plan:   Problem List Items Addressed This Visit   None Assessment and Plan Assessment & Plan Hypertension Hypertension is well-managed. Weight loss on a keto diet may impact blood pressure. - Continue current antihypertensive medications (Hydrochlorothiazide  and Lisinopril ). - Monitor blood pressure at home, especially if experiencing dizziness or lightheadedness. - Report any symptoms of dizziness or  lightheadedness. - Re-evaluate blood pressure and medication regimen in a few months, depending on weight loss progress.  Obesity She has lost 10-15 pounds since starting a keto diet. Emphasized the importance of a balanced keto diet and potential for weight rebound if not maintained. - Continue with the keto diet, focusing on a balanced intake of vegetables and lean meats. - Ensure adequate hydration with at least 64 ounces of water daily. - Consider incorporating physical activity into the routine to aid in weight loss and blood pressure management.  left hip adductor muscle tightness with limited range of motion Limited range of motion and discomfort in the left hip adductor muscle, likely due to overcompensation from a previous ankle sprain. The condition has persisted for approximately ten years with some improvement. - Provide exercises to improve flexibility and range of motion in the right hip adductor muscle. - Advise to stop exercises if she causes pain. - Re-evaluate the condition in two months.    Return in about 8 weeks (around 02/13/2024).    Adina Crandall, NP

## 2023-12-19 NOTE — Patient Instructions (Signed)
 Nice to see you today  Lets continue the medications as is for now Try the exercises Follow up with me in 2 months for your physical and labs

## 2024-01-03 ENCOUNTER — Ambulatory Visit (INDEPENDENT_AMBULATORY_CARE_PROVIDER_SITE_OTHER): Admitting: Nurse Practitioner

## 2024-01-03 ENCOUNTER — Encounter: Payer: Self-pay | Admitting: Nurse Practitioner

## 2024-01-03 VITALS — BP 138/98 | HR 92 | Temp 98.7°F | Ht 69.0 in | Wt 235.2 lb

## 2024-01-03 DIAGNOSIS — R399 Unspecified symptoms and signs involving the genitourinary system: Secondary | ICD-10-CM | POA: Diagnosis not present

## 2024-01-03 LAB — POC URINALSYSI DIPSTICK (AUTOMATED)
Glucose, UA: NEGATIVE
Ketones, UA: 15
Nitrite, UA: NEGATIVE
Protein, UA: NEGATIVE
Spec Grav, UA: 1.02 (ref 1.010–1.025)
Urobilinogen, UA: 0.2 U/dL
pH, UA: 6 (ref 5.0–8.0)

## 2024-01-03 LAB — URINALYSIS, ROUTINE W REFLEX MICROSCOPIC
Hgb urine dipstick: NEGATIVE
Nitrite: NEGATIVE
Specific Gravity, Urine: 1.02 (ref 1.000–1.030)
Urine Glucose: NEGATIVE
Urobilinogen, UA: 0.2 (ref 0.0–1.0)
pH: 6 (ref 5.0–8.0)

## 2024-01-03 MED ORDER — NITROFURANTOIN MONOHYD MACRO 100 MG PO CAPS: 100.0000 mg | ORAL_CAPSULE | Freq: Two times a day (BID) | ORAL | 0 refills | Status: DC

## 2024-01-03 NOTE — Progress Notes (Signed)
 Leron Glance, NP-C Phone: [phone]  Linda Love is a 48 y.o. female who presents today for UTI symptoms.   Discussed the use of AI scribe software for clinical note transcription with the patient, who gave verbal consent to proceed.  History of Present Illness   Linda Love is a 48 year old female who presents with symptoms suggestive of a urinary tract infection (UTI).  She has been experiencing symptoms for about a week, which have progressively worsened. Initially, she hoped the symptoms would resolve on her own, but she woke up this morning with noticeable lower back pain, primarily on the right side.  Her symptoms include dysuria, increased urinary frequency with little output, urgency, and a sensation of lower abdominal pressure. No hematuria, fever, or significant abdominal pain. She began taking Azo yesterday, which has turned her urine a bright yellow color. She has no known allergies and is not currently on any significant medications.  She mentions a recent increase in sexual activity due to a new relationship and recalls having a yeast infection about a month ago.  She recently started a keto diet a couple of months ago and has been consuming electrolyte drinks.   Social History   Tobacco Use  Smoking Status Former   Current packs/day: 0.00   Types: Cigarettes   Start date: 10/27/2005   Quit date: 10/28/2010   Years since quitting: 13.1  Smokeless Tobacco Never  Tobacco Comments   5 years off and on, socially smoking     Current Outpatient Medications on File Prior to Visit  Medication Sig Dispense Refill   aspirin EC 81 MG tablet Take 81 mg by mouth daily. Swallow whole.     busPIRone  (BUSPAR ) 5 MG tablet Take 1 tablet by mouth twice daily 180 tablet 0   hydrochlorothiazide  (HYDRODIURIL ) 12.5 MG tablet Take 1 tablet by mouth once daily 90 tablet 2   hydrOXYzine  (ATARAX ) 10 MG tablet Take 1 tablet (10 mg total) by mouth 3 (three) times daily as needed. 30  tablet 2   lisinopril  (ZESTRIL ) 10 MG tablet Take 1 tablet by mouth once daily 90 tablet 1   Multiple Vitamin (MULTIVITAMIN) tablet Take 1 tablet by mouth daily.       Omega-3 Fatty Acids (FISH OIL) 435 MG CAPS Take 435 mg by mouth daily.     No current facility-administered medications on file prior to visit.     ROS see history of present illness  Objective  Physical Exam Vitals:   01/03/24 1350  BP: (!) 138/98  Pulse: 92  Temp: 98.7 F (37.1 C)  SpO2: 96%    BP Readings from Last 3 Encounters:  01/03/24 (!) 138/98  12/19/23 124/80  03/03/23 120/86   Wt Readings from Last 3 Encounters:  01/03/24 235 lb 3.2 oz (106.7 kg)  12/19/23 237 lb 9.6 oz (107.8 kg)  03/03/23 256 lb 3.2 oz (116.2 kg)    Physical Exam Constitutional:      General: She is not in acute distress.    Appearance: Normal appearance.  HENT:     Head: Normocephalic.  Cardiovascular:     Rate and Rhythm: Normal rate and regular rhythm.     Heart sounds: Normal heart sounds.  Pulmonary:     Effort: Pulmonary effort is normal.     Breath sounds: Normal breath sounds.  Abdominal:     General: Abdomen is flat. Bowel sounds are normal.     Palpations: Abdomen is soft.  Tenderness: There is abdominal tenderness in the suprapubic area.  Skin:    General: Skin is warm and dry.  Neurological:     General: No focal deficit present.     Mental Status: She is alert.  Psychiatric:        Mood and Affect: Mood normal.        Behavior: Behavior normal.      Assessment/Plan: Please see individual problem list.  UTI symptoms Assessment & Plan: UA in office with trace blood, protein, and leukocytes. Due to symptom duration and progression we will go ahead with antibiotics until culture returns. Prescribe Macrobid  100 mg BID for 5 days. Send urine for microscopic analysis and culture. We will contact patient with results and if antibiotic needs to be changed. Advise adequate hydration. Instruct to  urinate post-coitus and maintain perineal hygiene. Return precautions given to patient.   Orders: -     POCT Urinalysis Dipstick (Automated) -     Urinalysis, Routine w reflex microscopic -     Urine Culture -     Nitrofurantoin  Monohyd Macro; Take 1 capsule (100 mg total) by mouth 2 (two) times daily.  Dispense: 10 capsule; Refill: 0     Return if symptoms worsen or fail to improve.   Leron Glance, NP-C [facility]

## 2024-01-03 NOTE — Assessment & Plan Note (Addendum)
 UA in office with trace blood, protein, and leukocytes. Due to symptom duration and progression we will go ahead with antibiotics until culture returns. Prescribe Macrobid  100 mg BID for 5 days. Send urine for microscopic analysis and culture. We will contact patient with results and if antibiotic needs to be changed. Advise adequate hydration. Instruct to urinate post-coitus and maintain perineal hygiene. Return precautions given to patient.

## 2024-01-04 LAB — URINE CULTURE
MICRO NUMBER:: 17067359
Result:: NO GROWTH
SPECIMEN QUALITY:: ADEQUATE

## 2024-01-06 ENCOUNTER — Ambulatory Visit: Payer: Self-pay | Admitting: Nurse Practitioner

## 2024-02-14 ENCOUNTER — Encounter: Admitting: Nurse Practitioner

## 2024-02-15 ENCOUNTER — Other Ambulatory Visit: Payer: Self-pay | Admitting: Nurse Practitioner

## 2024-02-15 DIAGNOSIS — I10 Essential (primary) hypertension: Secondary | ICD-10-CM

## 2024-02-20 ENCOUNTER — Ambulatory Visit (INDEPENDENT_AMBULATORY_CARE_PROVIDER_SITE_OTHER): Admitting: Nurse Practitioner

## 2024-02-20 ENCOUNTER — Encounter: Payer: Self-pay | Admitting: Nurse Practitioner

## 2024-02-20 VITALS — BP 108/78 | HR 81 | Temp 98.1°F | Ht 68.0 in | Wt 225.4 lb

## 2024-02-20 DIAGNOSIS — E669 Obesity, unspecified: Secondary | ICD-10-CM | POA: Diagnosis not present

## 2024-02-20 DIAGNOSIS — Z126 Encounter for screening for malignant neoplasm of bladder: Secondary | ICD-10-CM | POA: Diagnosis not present

## 2024-02-20 DIAGNOSIS — Z0000 Encounter for general adult medical examination without abnormal findings: Secondary | ICD-10-CM

## 2024-02-20 DIAGNOSIS — Z1231 Encounter for screening mammogram for malignant neoplasm of breast: Secondary | ICD-10-CM | POA: Diagnosis not present

## 2024-02-20 DIAGNOSIS — F411 Generalized anxiety disorder: Secondary | ICD-10-CM | POA: Diagnosis not present

## 2024-02-20 DIAGNOSIS — Z1211 Encounter for screening for malignant neoplasm of colon: Secondary | ICD-10-CM | POA: Diagnosis not present

## 2024-02-20 DIAGNOSIS — E049 Nontoxic goiter, unspecified: Secondary | ICD-10-CM

## 2024-02-20 DIAGNOSIS — I10 Essential (primary) hypertension: Secondary | ICD-10-CM

## 2024-02-20 NOTE — Assessment & Plan Note (Signed)
 Patient currently maintained on lisinopril  10 mg daily and hydrochlorothiazide  12.5 mg daily.  Patient has lost weight and working lifestyle modifications and would like to come off some of the medicines.  Will decrease lisinopril  from 10 mg to 5 mg daily.  Patient will continue hydrochlorothiazide  12.5 mg daily.  She will spot check blood pressure at home 1-2 times a week and follow-up with me in 3 months.

## 2024-02-20 NOTE — Assessment & Plan Note (Signed)
 Patient currently maintained on BuSpar  5 mg daily and hydroxyzine  3 times daily as needed.  Stable continue medication as prescribed

## 2024-02-20 NOTE — Assessment & Plan Note (Signed)
 Patient has lost weight continue work on her lifestyle modifications inclusive of diet and exercise.

## 2024-02-20 NOTE — Progress Notes (Signed)
 Established Patient Office Visit  Subjective   Patient ID: Linda Love, female    DOB: 12-21-1975  Age: 48 y.o. MRN: [id number]  Chief Complaint  Patient presents with   Annual Exam    HPI  HTN: Patient currently maintained on hydrochlorothiazide  12.5 mg daily and lisinopril  10 mg daily. States that she does well. She is wanting to decrease the amount of blood pressure medication that she is on   Anxiety: Currently maintained on BuSpar  5 mg daily, hydroxyzine  10 mg 3 times daily as needed.  for complete physical and follow up of chronic conditions.  Immunizations: -Tetanus: Completed in 2024 -Influenza:refused  -Shingles: Too young -Pneumonia: Too young  Diet: Fair diet. States that she is doing keto and working on lifestyle modifcations. She is not drinking enough water.  Exercise: No regular exercise.  Eye exam: Completes annually. Wears glasses. Every other year Dental exam: Completes annually    Colonoscopy: cologuard ordered today  Lung Cancer Screening: Former smoker  PSA: 12/24/2021 with ASCUS cells.  Repeat 3 years due 2026  Mammogram: 01/21/2022, overdue. Order placed today  DEXA; too young  Sleep: going to bed around 12-1 am and will get up around 10a. Feeling rested     Review of Systems  Constitutional:  Negative for chills and fever.  Respiratory:  Negative for shortness of breath.   Cardiovascular:  Negative for chest pain and leg swelling.  Gastrointestinal:  Negative for abdominal pain, blood in stool, constipation, diarrhea, nausea and vomiting.       BM every other day   Genitourinary:  Negative for dysuria and hematuria.  Neurological:  Negative for dizziness, tingling and headaches.  Psychiatric/Behavioral:  Negative for hallucinations and suicidal ideas.       Objective:     BP 108/78   Pulse 81   Temp 98.1 F (36.7 C) (Oral)   Ht 5' 8 (1.727 m)   Wt 225 lb 6.4 oz (102.2 kg)   SpO2 95%   BMI 34.27 kg/m  BP Readings from  Last 3 Encounters:  02/20/24 108/78  01/03/24 (!) 138/98  12/19/23 124/80   Wt Readings from Last 3 Encounters:  02/20/24 225 lb 6.4 oz (102.2 kg)  01/03/24 235 lb 3.2 oz (106.7 kg)  12/19/23 237 lb 9.6 oz (107.8 kg)   SpO2 Readings from Last 3 Encounters:  02/20/24 95%  01/03/24 96%  12/19/23 95%      Physical Exam Vitals and nursing note reviewed.  Constitutional:      Appearance: Normal appearance.  HENT:     Right Ear: Tympanic membrane, ear canal and external ear normal.     Left Ear: Tympanic membrane, ear canal and external ear normal.     Mouth/Throat:     Mouth: Mucous membranes are moist.     Pharynx: Oropharynx is clear.  Eyes:     Extraocular Movements: Extraocular movements intact.     Pupils: Pupils are equal, round, and reactive to light.  Neck:     Thyroid : Thyromegaly present.   Cardiovascular:     Rate and Rhythm: Normal rate and regular rhythm.     Pulses: Normal pulses.     Heart sounds: Normal heart sounds.  Pulmonary:     Effort: Pulmonary effort is normal.     Breath sounds: Normal breath sounds.  Abdominal:     General: Bowel sounds are normal. There is no distension.     Palpations: There is no mass.     Tenderness:  There is no abdominal tenderness.     Hernia: No hernia is present.  Musculoskeletal:     Right lower leg: No edema.     Left lower leg: No edema.  Lymphadenopathy:     Cervical: No cervical adenopathy.  Skin:    General: Skin is warm.  Neurological:     General: No focal deficit present.     Mental Status: She is alert.     Deep Tendon Reflexes:     Reflex Scores:      Bicep reflexes are 2+ on the right side and 2+ on the left side.      Patellar reflexes are 2+ on the right side and 2+ on the left side.    Comments: Bilateral upper and lower extremity strength 5/5  Psychiatric:        Mood and Affect: Mood normal.        Behavior: Behavior normal.        Thought Content: Thought content normal.        Judgment:  Judgment normal.      No results found for any visits on 02/20/24.    The 10-year ASCVD risk score (Arnett DK, et al., 2019) is: 1.1%    Assessment & Plan:   Problem List Items Addressed This Visit       Cardiovascular and Mediastinum   HTN, goal below 140/90   Patient currently maintained on lisinopril  10 mg daily and hydrochlorothiazide  12.5 mg daily.  Patient has lost weight and working lifestyle modifications and would like to come off some of the medicines.  Will decrease lisinopril  from 10 mg to 5 mg daily.  Patient will continue hydrochlorothiazide  12.5 mg daily.  She will spot check blood pressure at home 1-2 times a week and follow-up with me in 3 months.      Relevant Orders   Comprehensive metabolic panel with GFR   CBC with Differential/Platelet   Hemoglobin A1c   TSH   Lipid panel     Other   Obesity (BMI 30-39.9)   Patient has lost weight continue work on her lifestyle modifications inclusive of diet and exercise.      Preventative health care - Primary   Did review patient's personal, surgical, social, family histories.  Patient is up-to-date on all age-appropriate vaccinations she would like.  Patient declined flu vaccine today.  Cologuard ordered for CRC screening.  Order for mammogram placed and patient given information to call and set up at her convenience.  Patient up-to-date on cervical cancer screening.  Patient was given information at discharge about preventative healthcare maintenance with anticipatory guidance.      Relevant Orders   Comprehensive metabolic panel with GFR   CBC with Differential/Platelet   TSH   GAD (generalized anxiety disorder)   Patient currently maintained on BuSpar  5 mg daily and hydroxyzine  3 times daily as needed.  Stable continue medication as prescribed      Other Visit Diagnoses       Screening mammogram for breast cancer       Relevant Orders   MM 3D SCREENING MAMMOGRAM BILATERAL BREAST     Screening for colon  cancer       Relevant Orders   Cologuard     Screening for bladder cancer       Relevant Orders   Urine Microscopic     Enlarged thyroid        Relevant Orders   US  THYROID   Return in about 3 months (around 05/22/2024) for BP recheck.    Adina Crandall, NP

## 2024-02-20 NOTE — Assessment & Plan Note (Signed)
 Did review patient's personal, surgical, social, family histories.  Patient is up-to-date on all age-appropriate vaccinations she would like.  Patient declined flu vaccine today.  Cologuard ordered for CRC screening.  Order for mammogram placed and patient given information to call and set up at her convenience.  Patient up-to-date on cervical cancer screening.  Patient was given information at discharge about preventative healthcare maintenance with anticipatory guidance.

## 2024-02-20 NOTE — Patient Instructions (Addendum)
 Nice to see you today  Follow up with me in 3 months, sooner If you need me   You can half the lisinopril  and take 5mg  daily    Call and get your mammogram done at   [facility] [address] ( on hospital grounds) [place], [place]  [phone]

## 2024-02-21 LAB — COMPREHENSIVE METABOLIC PANEL WITH GFR
ALT: 13 U/L (ref 0–35)
AST: 15 U/L (ref 0–37)
Albumin: 4.2 g/dL (ref 3.5–5.2)
Alkaline Phosphatase: 49 U/L (ref 39–117)
BUN: 12 mg/dL (ref 6–23)
CO2: 27 meq/L (ref 19–32)
Calcium: 8.9 mg/dL (ref 8.4–10.5)
Chloride: 101 meq/L (ref 96–112)
Creatinine, Ser: 0.58 mg/dL (ref 0.40–1.20)
GFR: 107.53 mL/min (ref >60.00)
Glucose, Bld: 106 mg/dL — ABNORMAL HIGH (ref 70–99)
Potassium: 4.1 meq/L (ref 3.5–5.1)
Sodium: 135 meq/L (ref 135–145)
Total Bilirubin: 0.6 mg/dL (ref 0.2–1.2)
Total Protein: 6.6 g/dL (ref 6.0–8.3)

## 2024-02-21 LAB — CBC WITH DIFFERENTIAL/PLATELET
Basophils Absolute: 0.1 K/uL (ref 0.0–0.1)
Basophils Relative: 1.4 % (ref 0.0–3.0)
Eosinophils Absolute: 0.4 K/uL (ref 0.0–0.7)
Eosinophils Relative: 4 % (ref 0.0–5.0)
HCT: 40 % (ref 36.0–46.0)
Hemoglobin: 13.3 g/dL (ref 12.0–15.0)
Lymphocytes Relative: 20.7 % (ref 12.0–46.0)
Lymphs Abs: 2.2 K/uL (ref 0.7–4.0)
MCHC: 33.4 g/dL (ref 30.0–36.0)
MCV: 92.2 fl (ref 78.0–100.0)
Monocytes Absolute: 0.6 K/uL (ref 0.1–1.0)
Monocytes Relative: 6.1 % (ref 3.0–12.0)
Neutro Abs: 7.1 K/uL (ref 1.4–7.7)
Neutrophils Relative %: 67.8 % (ref 43.0–77.0)
Platelets: 208 K/uL (ref 150.0–400.0)
RBC: 4.33 Mil/uL (ref 3.87–5.11)
RDW: 13.2 % (ref 11.5–15.5)
WBC: 10.4 K/uL (ref 4.0–10.5)

## 2024-02-21 LAB — LIPID PANEL
Cholesterol: 136 mg/dL (ref 0–200)
HDL: 44.8 mg/dL (ref >39.00)
LDL Cholesterol: 79 mg/dL (ref 0–99)
NonHDL: 90.95
Total CHOL/HDL Ratio: 3
Triglycerides: 59 mg/dL (ref 0.0–149.0)
VLDL: 11.8 mg/dL (ref 0.0–40.0)

## 2024-02-21 LAB — URINALYSIS, MICROSCOPIC ONLY

## 2024-02-21 LAB — TSH: TSH: 2.53 u[IU]/mL (ref 0.35–5.50)

## 2024-02-21 LAB — HEMOGLOBIN A1C: Hgb A1c MFr Bld: 5.6 % (ref 4.6–6.5)

## 2024-02-21 NOTE — Telephone Encounter (Signed)
 Copied from CRM [id number]. Topic: Appointments - Appointment Info/Confirmation >> Feb 20, 2024 11:19 AM Harlene ORN wrote: Patient/patient representative is calling for information regarding an appointment.  Will also like to have lab work done today as well.

## 2024-02-21 NOTE — Telephone Encounter (Signed)
 Called and spoke with patient. Relayed information to patient.  Gave her phone number for imaging center.  Pt wrote down and verbalized understanding. Has no questions or concerns.

## 2024-02-27 ENCOUNTER — Ambulatory Visit: Payer: Self-pay | Admitting: Nurse Practitioner

## 2024-03-01 ENCOUNTER — Other Ambulatory Visit: Payer: Self-pay | Admitting: Nurse Practitioner

## 2024-03-01 DIAGNOSIS — I10 Essential (primary) hypertension: Secondary | ICD-10-CM

## 2024-03-16 ENCOUNTER — Other Ambulatory Visit: Payer: Self-pay | Admitting: Nurse Practitioner

## 2024-03-16 DIAGNOSIS — F411 Generalized anxiety disorder: Secondary | ICD-10-CM

## 2024-04-16 ENCOUNTER — Telehealth: Admitting: Family Medicine

## 2024-04-16 DIAGNOSIS — R399 Unspecified symptoms and signs involving the genitourinary system: Secondary | ICD-10-CM

## 2024-04-16 DIAGNOSIS — M545 Low back pain, unspecified: Secondary | ICD-10-CM | POA: Diagnosis not present

## 2024-04-16 DIAGNOSIS — R6884 Jaw pain: Secondary | ICD-10-CM | POA: Diagnosis not present

## 2024-04-16 MED ORDER — NITROFURANTOIN MONOHYD MACRO 100 MG PO CAPS: 100.0000 mg | ORAL_CAPSULE | Freq: Two times a day (BID) | ORAL | 0 refills | Status: AC

## 2024-04-16 NOTE — Progress Notes (Signed)
 " Virtual Visit Consent   Linda Love, you are scheduled for a virtual visit with a [facility] provider today. Just as with appointments in the office, your consent must be obtained to participate. Your consent will be active for this visit and any virtual visit you may have with one of our providers in the next 365 days. If you have a MyChart account, a copy of this consent can be sent to you electronically.  As this is a virtual visit, video technology does not allow for your provider to perform a traditional examination. This may limit your provider's ability to fully assess your condition. If your provider identifies any concerns that need to be evaluated in person or the need to arrange testing (such as labs, EKG, etc.), we will make arrangements to do so. Although advances in technology are sophisticated, we cannot ensure that it will always work on either your end or our end. If the connection with a video visit is poor, the visit may have to be switched to a telephone visit. With either a video or telephone visit, we are not always able to ensure that we have a secure connection.  By engaging in this virtual visit, you consent to the provision of healthcare and authorize for your insurance to be billed (if applicable) for the services provided during this visit. Depending on your insurance coverage, you may receive a charge related to this service.  I need to obtain your verbal consent now. Are you willing to proceed with your visit today? Linda Love has provided verbal consent on 04/16/2024 for a virtual visit (video or telephone). Loa Lamp, FNP  Date: 04/16/2024 5:25 PM   Virtual Visit via Video Note   I, Loa Lamp, connected with  Linda Love  ([id number], [birth year]) on 04/16/24 at  5:15 PM EST by a video-enabled telemedicine application and verified that I am speaking with the correct person using two identifiers.  Location: Patient: Virtual Visit Location Patient:  Home Provider: Virtual Visit Location Provider: Home Office   I discussed the limitations of evaluation and management by telemedicine and the availability of in person appointments. The patient expressed understanding and agreed to proceed.    History of Present Illness: Linda Love is a 49 y.o. who identifies as a female who was assigned female at birth, and is being seen today for low back pain worse in am. On keto diet and not drinking well. Had period last week. NKI. Discomfort with urination, cloudy urine, pressure over bladder. No fever. Jaw pain with increased stress.No neck arm or chest pain. She may be clenching at night.  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  Resting comfortably  at home.  Head is normocephalic, atraumatic.  No labored breathing.  Speech is clear and coherent with logical content.  Patient is alert and oriented at baseline.    Assessment and Plan: 1. Jaw pain (Primary)  2. Acute bilateral low back pain without sciatica  Increase fluids, ibuprofen as directed,  UC if sx persist or worsen.   Follow Up Instructions: I discussed the assessment and treatment plan with the patient. The patient was provided an opportunity to ask questions and all were answered. The patient agreed with the plan and demonstrated an understanding of the instructions.  A copy of instructions were sent to the patient via MyChart unless otherwise noted below.     The patient was advised to call back or seek an in-person evaluation if the symptoms worsen or if the condition fails to improve as anticipated.    Momoko Slezak, FNP     [1] No Known Allergies [2]  Current Outpatient Medications:    busPIRone  (BUSPAR ) 5 MG tablet, Take 1 tablet by mouth twice daily, Disp: 180 tablet, Rfl: 0   hydrochlorothiazide  (HYDRODIURIL ) 12.5 MG tablet, Take 1 tablet by mouth once daily, Disp: 90 tablet, Rfl: 1   hydrOXYzine  (ATARAX ) 10 MG tablet, Take 1 tablet (10 mg total) by mouth 3 (three) times daily as needed., Disp: 30 tablet, Rfl: 2   lisinopril  (ZESTRIL ) 10 MG tablet, Take 1 tablet by mouth once daily, Disp: 90 tablet, Rfl: 0   Multiple Vitamin (MULTIVITAMIN) tablet, Take 1 tablet by mouth daily.  , Disp: , Rfl:    Omega-3 Fatty Acids (FISH OIL) 435 MG CAPS, Take 435 mg by mouth daily., Disp: , Rfl:   "

## 2024-04-16 NOTE — Patient Instructions (Signed)

## 2024-04-23 ENCOUNTER — Ambulatory Visit

## 2024-04-30 ENCOUNTER — Ambulatory Visit

## 2024-05-14 ENCOUNTER — Ambulatory Visit

## 2024-05-23 ENCOUNTER — Ambulatory Visit: Admitting: Nurse Practitioner
